# Patient Record
Sex: Male | Born: 1995 | Race: White | Hispanic: No | Marital: Single | Smoking: Current every day smoker
Health system: Southern US, Community
[De-identification: ages and names within clinical notes are randomized; demographics above are authoritative.]

## PROBLEM LIST (undated history)

## (undated) ENCOUNTER — Emergency Department (HOSPITAL_COMMUNITY): Admission: EM | Payer: Medicaid Other | Source: Home / Self Care

## (undated) DIAGNOSIS — S199XXA Unspecified injury of neck, initial encounter: Secondary | ICD-10-CM

---
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
## 2015-03-04 ENCOUNTER — Emergency Department (HOSPITAL_COMMUNITY)
Admission: EM | Admit: 2015-03-04 | Discharge: 2015-03-04 | Disposition: A | Discharge status: Home / Self Care | Payer: Medicaid Other | Attending: Emergency Medicine | Admitting: Emergency Medicine

## 2015-03-04 ENCOUNTER — Encounter (HOSPITAL_COMMUNITY): Payer: Self-pay | Admitting: Emergency Medicine

## 2015-03-04 ENCOUNTER — Emergency Department (HOSPITAL_COMMUNITY): Payer: Medicaid Other

## 2015-03-04 DIAGNOSIS — S52122A Displaced fracture of head of left radius, initial encounter for closed fracture: Secondary | ICD-10-CM

## 2015-03-04 DIAGNOSIS — S60511A Abrasion of right hand, initial encounter: Secondary | ICD-10-CM | POA: Diagnosis not present

## 2015-03-04 DIAGNOSIS — S0990XA Unspecified injury of head, initial encounter: Secondary | ICD-10-CM | POA: Diagnosis not present

## 2015-03-04 DIAGNOSIS — Y9351 Activity, roller skating (inline) and skateboarding: Secondary | ICD-10-CM | POA: Insufficient documentation

## 2015-03-04 DIAGNOSIS — S59902A Unspecified injury of left elbow, initial encounter: Secondary | ICD-10-CM | POA: Diagnosis present

## 2015-03-04 DIAGNOSIS — Y929 Unspecified place or not applicable: Secondary | ICD-10-CM | POA: Diagnosis not present

## 2015-03-04 DIAGNOSIS — S40212A Abrasion of left shoulder, initial encounter: Secondary | ICD-10-CM | POA: Insufficient documentation

## 2015-03-04 DIAGNOSIS — Y998 Other external cause status: Secondary | ICD-10-CM | POA: Insufficient documentation

## 2015-03-04 NOTE — Discharge Instructions (Signed)
Cast or Splint Care Casts and splints support injured limbs and keep bones from moving while they heal.  HOME CARE  Keep the cast or splint uncovered during the drying period.  A plaster cast can take 24 to 48 hours to dry.  A fiberglass cast will dry in less than 1 hour.  Do not rest the cast on anything harder than a pillow for 24 hours.  Do not put weight on your injured limb. Do not put pressure on the cast. Wait for your doctor's approval.  Keep the cast or splint dry.  Cover the cast or splint with a plastic bag during baths or wet weather.  If you have a cast over your chest and belly (trunk), take sponge baths until the cast is taken off.  If your cast gets wet, dry it with a towel or blow dryer. Use the cool setting on the blow dryer.  Keep your cast or splint clean. Wash a dirty cast with a damp cloth.  Do not put any objects under your cast or splint.  Do not scratch the skin under the cast with an object. If itching is a problem, use a blow dryer on a cool setting over the itchy area.  Do not trim or cut your cast.  Do not take out the padding from inside your cast.  Exercise your joints near the cast as told by your doctor.  Raise (elevate) your injured limb on 1 or 2 pillows for the first 1 to 3 days. GET HELP IF:  Your cast or splint cracks.  Your cast or splint is too tight or too loose.  You itch badly under the cast.  Your cast gets wet or has a soft spot.  You have a bad smell coming from the cast.  You get an object stuck under the cast.  Your skin around the cast becomes red or sore.  You have new or more pain after the cast is put on. GET HELP RIGHT AWAY IF:  You have fluid leaking through the cast.  You cannot move your fingers or toes.  Your fingers or toes turn blue or white or are cool, painful, or puffy (swollen).  You have tingling or lose feeling (numbness) around the injured area.  You have bad pain or pressure under the  cast.  You have trouble breathing or have shortness of breath.  You have chest pain. Document Released: 01/23/2011 Document Revised: 05/26/2013 Document Reviewed: 04/01/2013 [facility] Patient Information 2015 [employer] [place]. This information is not intended to replace advice given to you by your health care provider. Make sure you discuss any questions you have with your health care provider. You have a radial head fracture.  You've been placed in a splint to help with immobilization.  You've also been given a referral to orthopedic surgery to monitor the healing, fortunately is not displaced but you do have a small amount of fluid in the joint.  Please try to keep your arm up as much as possible, place an ice bag over the splint for 20 minutes at a time for the next 24-48 hours
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
## 2015-03-04 NOTE — ED Notes (Signed)
Ortho tech at bedside 

## 2015-03-04 NOTE — ED Provider Notes (Signed)
CSN: [id number]     Arrival date & time 03/04/15  1925 History  This chart was scribed for Earley FavorGail Lamisha Roussell, NP, Linwood DibblesJon Knapp, MD by Leona CarryG. Clay Sherrill, ED Scribe. The patient was seen in [facility]. The patient's care was started at 8:08 PM.     Chief Complaint  Patient presents with  . Elbow Injury   The history is provided by the patient. No language interpreter was used.   HPI Comments: Johnathan Flores is a 19 y.o. male who presents to the Emergency Department complaining of a left elbow injury that occurred approximately 5 hours ago while skateboarding. Patient reports that he fell twice within 10 minutes and landed on his elbow each time to brace his fall. He reports that he hit his head on a telephone pole the second time he fell. He denies LOC, dizziness, blurry vision, or neck pain. Patient reports that his last tetanus shot was approximately 8 years ago. Also has abrasions to R palm and L shoulder    History reviewed. No pertinent past medical history. History reviewed. No pertinent past surgical history. No family history on file. History  Substance Use Topics  . Smoking status: Not on file  . Smokeless tobacco: Not on file  . Alcohol Use: Not on file    Review of Systems  HENT: Negative for ear discharge.   Eyes: Negative for visual disturbance.  Musculoskeletal: Positive for joint swelling.  Skin: Positive for wound.  Neurological: Positive for headaches. Negative for dizziness.  All other systems reviewed and are negative.     Allergies  Review of patient's allergies indicates no known allergies.  Home Medications   Prior to Admission medications   Not on File   Triage Vitals: BP 114/68 mmHg  Temp(Src) 98.5 F (36.9 C) (Oral)  Resp 20  SpO2 99% Physical Exam  Constitutional: He is oriented to person, place, and time. He appears well-developed and well-nourished.  HENT:  Head: Normocephalic.  Right Ear: No swelling.  Left Ear: No swelling.  Eyes: Pupils are  equal, round, and reactive to light.  Neck: Normal range of motion. No spinous process tenderness and no muscular tenderness present. Normal range of motion present.  Cardiovascular: Normal rate and regular rhythm.   Pulmonary/Chest: Effort normal and breath sounds normal.  Musculoskeletal: He exhibits tenderness. He exhibits no edema.  Neurological: He is alert and oriented to person, place, and time.  Skin: There is erythema.  Facial abrasions to the anterior portion of his left shoulder slightly deeper abrasions to the palm of the right hand  Nursing note and vitals reviewed.   ED Course  Procedures (including critical care time) DIAGNOSTIC STUDIES: Oxygen Saturation is 99% on room air, normal by my interpretation.    COORDINATION OF CARE:    Labs Review Labs Reviewed - No data to display  Imaging Review Dg Elbow Complete Left  03/04/2015   CLINICAL DATA:  Skateboard injury with elbow pain, initial encounter  EXAM: LEFT ELBOW - COMPLETE 3+ VIEW  COMPARISON:  None.  FINDINGS: There is a minimally displaced fracture at the junction of the head and neck of the radius. Joint effusion is noted. No other fracture is seen.  IMPRESSION: Proximal radial fracture with joint effusion.   Electronically Signed   By: Alcide CleverMark  Lukens M.D.   On: 03/04/2015 20:18     EKG Interpretation None      MDM   Final diagnoses:  Radial head fracture, closed, left, initial encounter    .I  personally performed the services described in this documentation, which was scribed in my presence. The recorded information has been reviewed and is accurate.   Earley Favor, NP 03/04/15 7425  Linwood Dibbles, MD 03/08/15 [phone]

## 2015-12-30 ENCOUNTER — Emergency Department (HOSPITAL_COMMUNITY)
Admission: EM | Admit: 2015-12-30 | Discharge: 2015-12-30 | Disposition: A | Discharge status: Home / Self Care | Payer: Medicaid Other | Attending: Emergency Medicine | Admitting: Emergency Medicine

## 2015-12-30 ENCOUNTER — Encounter (HOSPITAL_COMMUNITY): Payer: Self-pay | Admitting: Emergency Medicine

## 2015-12-30 DIAGNOSIS — F17200 Nicotine dependence, unspecified, uncomplicated: Secondary | ICD-10-CM | POA: Insufficient documentation

## 2015-12-30 DIAGNOSIS — R109 Unspecified abdominal pain: Secondary | ICD-10-CM | POA: Insufficient documentation

## 2015-12-30 LAB — URINALYSIS, ROUTINE W REFLEX MICROSCOPIC
BILIRUBIN URINE: NEGATIVE
Glucose, UA: NEGATIVE mg/dL
Hgb urine dipstick: NEGATIVE
KETONES UR: NEGATIVE mg/dL
Leukocytes, UA: NEGATIVE
NITRITE: NEGATIVE
Protein, ur: NEGATIVE mg/dL
Specific Gravity, Urine: 1.004 — ABNORMAL LOW (ref 1.005–1.030)
pH: 6.5 (ref 5.0–8.0)
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
## 2015-12-30 NOTE — Discharge Instructions (Signed)
1. Medications: usual home medications 2. Treatment: rest, drink plenty of fluids 3. Follow Up: please followup with your primary doctor for discussion of your diagnoses and further evaluation after today's visit; if you do not have a primary care doctor use the phone number listed in your discharge paperwork to find one; please return to the ER for increased pain, new or worsening symptoms   Flank Pain Flank pain is pain in your side. The flank is the area of your side between your upper belly (abdomen) and your back. Pain in this area can be caused by many different things. HOME CARE Home care and treatment will depend on the cause of your pain.  Rest as told by your doctor.  Drink enough fluids to keep your pee (urine) clear or pale yellow.  Only take medicine as told by your doctor.  Tell your doctor about any changes in your pain.  Follow up with your doctor. GET HELP RIGHT AWAY IF:   Your pain does not get better with medicine.   You have new symptoms or your symptoms get worse.  Your pain gets worse.   You have belly (abdominal) pain.   You are short of breath.   You always feel sick to your stomach (nauseous).   You keep throwing up (vomiting).   You have puffiness (swelling) in your belly.   You feel light-headed or you pass out (faint).   You have blood in your pee.  You have a fever or lasting symptoms for more than 2-3 days.  You have a fever and your symptoms suddenly get worse. MAKE SURE YOU:   Understand these instructions.  Will watch your condition.  Will get help right away if you are not doing well or get worse.   This information is not intended to replace advice given to you by your health care provider. Make sure you discuss any questions you have with your health care provider.   Document Released: 07/02/2008 Document Revised: 10/14/2014 Document Reviewed: 05/07/2012 Elsevier Interactive Patient Education [employer]2016 Elsevier  Inc.

## 2015-12-30 NOTE — ED Provider Notes (Signed)
CSN: [id number]     Arrival date & time 12/30/15  0207 History   First MD Initiated Contact with Patient 12/30/15 [phone]     Chief Complaint  Patient presents with  . Flank Pain    left side    HPI   Johnathan Flores is a 20 y.o. male with no pertinent PMH who presents to the ED with left flank pain, which he states started around 2-3 AM and is now resolved. He states his pain was constant. He denies precipitating or alleviating factors. He reports increased volume of urine, though denies frequency, urgency, dysuria, hematuria. He denies fever, chills, abdominal pan, N/V/D/C. Currently, he denies complaints. He states he had one beer prior to arrival.    History reviewed. No pertinent past medical history. History reviewed. No pertinent past surgical history. No family history on file. Social History  Substance Use Topics  . Smoking status: Current Every Day Smoker -- 1.00 packs/day  . Smokeless tobacco: None  . Alcohol Use: Yes     Comment: rarely     Review of Systems  Constitutional: Negative for fever and chills.  Gastrointestinal: Negative for nausea, vomiting, abdominal pain, diarrhea and constipation.  Genitourinary: Positive for flank pain. Negative for dysuria, urgency, frequency and hematuria.      Allergies  Review of patient's allergies indicates no known allergies.  Home Medications   Prior to Admission medications   Not on File    BP 110/63 mmHg  Pulse 73  Temp(Src) 98.6 F (37 C) (Oral)  Resp 14  Ht  (1.803 m)  Wt 58.06 kg  BMI 17.86 kg/m2  SpO2 99% Physical Exam  Constitutional: He is oriented to person, place, and time. He appears well-developed and well-nourished. No distress.  HENT:  Head: Normocephalic and atraumatic.  Right Ear: External ear normal.  Left Ear: External ear normal.  Nose: Nose normal.  Mouth/Throat: Uvula is midline, oropharynx is clear and moist and mucous membranes are normal.  Eyes: Conjunctivae, EOM and lids are  normal. Pupils are equal, round, and reactive to light. Right eye exhibits no discharge. Left eye exhibits no discharge. No scleral icterus.  Neck: Normal range of motion. Neck supple.  Cardiovascular: Normal rate, regular rhythm, normal heart sounds, intact distal pulses and normal pulses.   Pulmonary/Chest: Effort normal and breath sounds normal. No respiratory distress. He has no wheezes. He has no rales.  Abdominal: Soft. Normal appearance and bowel sounds are normal. He exhibits no distension and no mass. There is no tenderness. There is no rigidity, no rebound, no guarding and no CVA tenderness.  Musculoskeletal: Normal range of motion. He exhibits no edema or tenderness.  Neurological: He is alert and oriented to person, place, and time.  Skin: Skin is warm, dry and intact. No rash noted. He is not diaphoretic. No erythema. No pallor.  Psychiatric: He has a normal mood and affect. His speech is normal and behavior is normal.  Nursing note and vitals reviewed.   ED Course  Procedures (including critical care time)  Labs Review Labs Reviewed  URINALYSIS, ROUTINE W REFLEX MICROSCOPIC (NOT AT [facility]) - Abnormal; Notable for the following:    Specific Gravity, Urine 1.004 (*)    All other components within normal limits    Imaging Review No results found.   I have personally reviewed and evaluated these lab results as part of my medical decision-making.   EKG Interpretation None      MDM   Final diagnoses:  Left  flank pain    20 year old male presents with left sided flank pain, which he reports started overnight prior to arrival. Denies frequency, urgency, dysuria, hematuria, fever, chills, abdominal pan, N/V/D/C. Patient is afebrile. Vital signs stable. Abdomen soft, non-tender, non-distended. No rebound, guarding, or masses. No CVA tenderness. UA negative for hematuria, doubt kidney stone. No evidence of UTI. 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 Patient is non-toxic and well-appearing and is clinically sober. Reports symptoms now resolved. Feel he is stable for discharge at this time. Patient to follow-up with PCP. Strict return precautions discussed. Patient verbalizes his understanding and is in agreement with plan.  BP 110/63 mmHg  Pulse 73  Temp(Src) 98.6 F (37 C) (Oral)  Resp 14  Ht 5\' 11"  (1.803 m)  Wt 58.06 kg  BMI 17.86 kg/m2  SpO2 99%     Mady Gemmalizabeth C Westfall, PA-C 12/30/15 [phone]  Raeford RazorStephen Kohut, MD 01/02/16 1026

## 2015-12-30 NOTE — ED Notes (Signed)
Bed: WA24 Expected date:  Expected time:  Means of arrival:  Comments: Needs cleaning  

## 2016-07-02 ENCOUNTER — Emergency Department (HOSPITAL_COMMUNITY)
Admission: EM | Admit: 2016-07-02 | Discharge: 2016-07-02 | Disposition: A | Discharge status: Home / Self Care | Payer: Medicaid Other | Attending: Emergency Medicine | Admitting: Emergency Medicine

## 2016-07-02 ENCOUNTER — Emergency Department (HOSPITAL_COMMUNITY): Payer: Medicaid Other

## 2016-07-02 ENCOUNTER — Encounter (HOSPITAL_COMMUNITY): Payer: Self-pay | Admitting: Emergency Medicine

## 2016-07-02 DIAGNOSIS — S62309A Unspecified fracture of unspecified metacarpal bone, initial encounter for closed fracture: Secondary | ICD-10-CM

## 2016-07-02 DIAGNOSIS — S62314A Displaced fracture of base of fourth metacarpal bone, right hand, initial encounter for closed fracture: Secondary | ICD-10-CM | POA: Insufficient documentation

## 2016-07-02 DIAGNOSIS — Y929 Unspecified place or not applicable: Secondary | ICD-10-CM | POA: Insufficient documentation

## 2016-07-02 DIAGNOSIS — F17200 Nicotine dependence, unspecified, uncomplicated: Secondary | ICD-10-CM | POA: Insufficient documentation

## 2016-07-02 DIAGNOSIS — Y999 Unspecified external cause status: Secondary | ICD-10-CM | POA: Insufficient documentation

## 2016-07-02 DIAGNOSIS — S62316A Displaced fracture of base of fifth metacarpal bone, right hand, initial encounter for closed fracture: Secondary | ICD-10-CM | POA: Insufficient documentation

## 2016-07-02 DIAGNOSIS — Y939 Activity, unspecified: Secondary | ICD-10-CM | POA: Insufficient documentation

## 2016-07-02 MED ORDER — HYDROCODONE-ACETAMINOPHEN 5-325 MG PO TABS: 1.0000 | ORAL_TABLET | Freq: Four times a day (QID) | ORAL | 0 refills | Status: DC | PRN
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
## 2016-07-02 NOTE — Discharge Instructions (Signed)
Return here as needed. Follow up with the hand surgeon provided. Ice and elevate the hand
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
## 2017-12-07 ENCOUNTER — Emergency Department (HOSPITAL_COMMUNITY)
Admission: EM | Admit: 2017-12-07 | Discharge: 2017-12-07 | Disposition: A | Discharge status: Home / Self Care | Payer: Self-pay | Attending: Emergency Medicine | Admitting: Emergency Medicine

## 2017-12-07 ENCOUNTER — Emergency Department (HOSPITAL_COMMUNITY): Payer: Self-pay

## 2017-12-07 ENCOUNTER — Encounter (HOSPITAL_COMMUNITY): Payer: Self-pay | Admitting: Emergency Medicine

## 2017-12-07 ENCOUNTER — Other Ambulatory Visit: Payer: Self-pay

## 2017-12-07 DIAGNOSIS — S161XXA Strain of muscle, fascia and tendon at neck level, initial encounter: Secondary | ICD-10-CM | POA: Insufficient documentation

## 2017-12-07 DIAGNOSIS — Y9301 Activity, walking, marching and hiking: Secondary | ICD-10-CM | POA: Insufficient documentation

## 2017-12-07 DIAGNOSIS — F17200 Nicotine dependence, unspecified, uncomplicated: Secondary | ICD-10-CM | POA: Insufficient documentation

## 2017-12-07 DIAGNOSIS — M25552 Pain in left hip: Secondary | ICD-10-CM | POA: Insufficient documentation

## 2017-12-07 DIAGNOSIS — W19XXXA Unspecified fall, initial encounter: Secondary | ICD-10-CM

## 2017-12-07 DIAGNOSIS — Y999 Unspecified external cause status: Secondary | ICD-10-CM | POA: Insufficient documentation

## 2017-12-07 DIAGNOSIS — W010XXA Fall on same level from slipping, tripping and stumbling without subsequent striking against object, initial encounter: Secondary | ICD-10-CM | POA: Insufficient documentation

## 2017-12-07 DIAGNOSIS — Y92512 Supermarket, store or market as the place of occurrence of the external cause: Secondary | ICD-10-CM | POA: Insufficient documentation

## 2017-12-07 DIAGNOSIS — M25522 Pain in left elbow: Secondary | ICD-10-CM | POA: Insufficient documentation

## 2017-12-07 DIAGNOSIS — S39012A Strain of muscle, fascia and tendon of lower back, initial encounter: Secondary | ICD-10-CM | POA: Insufficient documentation

## 2017-12-07 MED ORDER — OXYCODONE-ACETAMINOPHEN 5-325 MG PO TABS
1.0000 | ORAL_TABLET | Freq: Once | ORAL | Status: AC
  Administered 2017-12-07: 1 via ORAL
  Filled 2017-12-07: qty 1

## 2017-12-07 MED ORDER — CYCLOBENZAPRINE HCL 10 MG PO TABS: 10.0000 mg | ORAL_TABLET | Freq: Two times a day (BID) | ORAL | 0 refills | Status: DC | PRN

## 2017-12-07 MED ORDER — NAPROXEN 500 MG PO TABS: 500.0000 mg | ORAL_TABLET | Freq: Two times a day (BID) | ORAL | 0 refills | Status: AC

## 2017-12-07 NOTE — ED Provider Notes (Signed)
[facility]-EMERGENCY DEPT Provider Note   CSN: [id number] Arrival date & time: 12/07/17  1923     History   Chief Complaint Chief Complaint  Patient presents with  . Fall    HPI Johnathan Flores is a 22 y.o. male with no significant past medical history, who presents to ED for injuries that occurred after fall prior to arrival.  [place] [employer] when he slipped on a puddle of water.  He states that he fell backwards and braced himself with his left elbow.  He currently complains of left elbow pain, left hip pain, neck and lower back pain.  He denies any head injury or loss of consciousness.  He was able to stand up after the fall with assistance.  Denies any vision changes, numbness in arms or legs, loss of bowel or bladder function, prior back or neck surgeries, amnesia, blood thinner use.  HPI  History reviewed. No pertinent past medical history.  There are no active problems to display for this patient.   History reviewed. No pertinent surgical history.     Home Medications    Prior to Admission medications   Medication Sig Start Date End Date Taking? Authorizing Provider  cyclobenzaprine (FLEXERIL) 10 MG tablet Take 1 tablet (10 mg total) by mouth 2 (two) times daily as needed for muscle spasms. 12/07/17   Stanlee Roehrig, PA-C  naproxen (NAPROSYN) 500 MG tablet Take 1 tablet (500 mg total) by mouth 2 (two) times daily. 12/07/17   Dietrich Pates, PA-C    Family History History reviewed. No pertinent family history.  Social History Social History   Tobacco Use  . Smoking status: Current Every Day Smoker    Packs/day: 1.00  . Smokeless tobacco: Never Used  Substance Use Topics  . Alcohol use: Yes    Comment: rarely  . Drug use: Yes    Types: Marijuana     Allergies   Patient has no known allergies.   Review of Systems Review of Systems  Constitutional: Negative for appetite change, chills and fever.  HENT:  Negative for ear pain, rhinorrhea, sneezing and sore throat.   Eyes: Negative for photophobia and visual disturbance.  Respiratory: Negative for cough, chest tightness, shortness of breath and wheezing.   Cardiovascular: Negative for chest pain and palpitations.  Gastrointestinal: Negative for abdominal pain, blood in stool, constipation, diarrhea, nausea and vomiting.  Genitourinary: Negative for dysuria, hematuria and urgency.  Musculoskeletal: Positive for arthralgias, back pain, myalgias and neck pain.  Skin: Negative for rash.  Neurological: Negative for dizziness, weakness and light-headedness.     Physical Exam Updated Vital Signs BP (!) 110/48 (BP Location: Right Arm)   Pulse 77   Temp 98.4 F (36.9 C)   Resp (!) 7   Ht 5\' 11"  (1.803 m)   Wt 58.1 kg (128 lb)   SpO2 97%   BMI 17.85 kg/m   Physical Exam  Constitutional: He is oriented to person, place, and time. He appears well-developed and well-nourished. No distress.  HENT:  Head: Normocephalic and atraumatic.  Nose: Nose normal.  Eyes: Conjunctivae and EOM are normal. Pupils are equal, round, and reactive to light. Right eye exhibits no discharge. Left eye exhibits no discharge. No scleral icterus.  Neck: Normal range of motion. Neck supple.    Cardiovascular: Normal rate, regular rhythm, normal heart sounds and intact distal pulses. Exam reveals no gallop and no friction rub.  No murmur heard. Pulmonary/Chest: Effort normal  and breath sounds normal. No respiratory distress.  Abdominal: Soft. Bowel sounds are normal. He exhibits no distension. There is no tenderness. There is no guarding.  Musculoskeletal: Normal range of motion. He exhibits tenderness. He exhibits no edema.       Back:  Tenderness to palpation at the midline of C and L-spine as indicated in the image.  No step-off palpated. No visible bruising, edema or temperature change noted. No objective signs of numbness present. No saddle anesthesia. 2+ DP  pulses bilaterally. Sensation intact to light touch. Strength 5/5 in bilateral lower extremities. TTP of L hip and L elbow.  No visible deformities or changes to range of motion noted.  Neurological: He is alert and oriented to person, place, and time. No cranial nerve deficit or sensory deficit. He exhibits normal muscle tone. Coordination normal.  Pupils reactive. No facial asymmetry noted. Cranial nerves appear grossly intact. Sensation intact to light touch on face, BUE and BLE. Strength 5/5 in BUE and BLE.  Skin: Skin is warm and dry. No rash noted.  Psychiatric: He has a normal mood and affect.  Nursing note and vitals reviewed.    ED Treatments / Results  Labs (all labs ordered are listed, but only abnormal results are displayed) Labs Reviewed - No data to display  EKG  EKG Interpretation None       Radiology Dg Lumbar Spine Complete  Result Date: 12/07/2017 CLINICAL DATA:  Low back pain after fall today. EXAM: LUMBAR SPINE - COMPLETE 4+ VIEW COMPARISON:  None. FINDINGS: There is no evidence of lumbar spine fracture. Alignment is normal. Intervertebral disc spaces are maintained. IMPRESSION: Normal lumbar spine. Electronically Signed   By: Lupita Raider, M.D.   On: 12/07/2017 21:52   Dg Elbow Complete Left  Result Date: 12/07/2017 CLINICAL DATA:  Left elbow pain after fall today. EXAM: LEFT ELBOW - COMPLETE 3+ VIEW COMPARISON:  Radiographs of Mar 04, 2015. FINDINGS: There is no evidence of fracture, dislocation, or joint effusion. There is no evidence of arthropathy or other focal bone abnormality. Soft tissues are unremarkable. IMPRESSION: Normal left elbow. Electronically Signed   By: Lupita Raider, M.D.   On: 12/07/2017 21:50   Ct Cervical Spine Wo Contrast  Result Date: 12/07/2017 CLINICAL DATA:  Slipped on water at [employer].  Cervical neck pain. EXAM: CT CERVICAL SPINE WITHOUT CONTRAST TECHNIQUE: Multidetector CT imaging of the cervical spine was performed without  intravenous contrast. Multiplanar CT image reconstructions were also generated. COMPARISON:  None. FINDINGS: Alignment: Mild reversal of normal lordosis. No traumatic subluxation. Skull base and vertebrae: No acute fracture. Vertebral body heights are maintained. The dens and skull base are intact. Soft tissues and spinal canal: No prevertebral fluid or swelling. No visible canal hematoma. Disc levels:  Disc spaces are preserved. Upper chest: Negative. Other: None. IMPRESSION: 1. No cervical spine fracture. 2. Mild reversal of cervical lordosis typically seen with positioning or muscle spasm. Electronically Signed   By: Rubye Oaks M.D.   On: 12/07/2017 21:32   Dg Hip Unilat W Or Wo Pelvis 2-3 Views Left  Result Date: 12/07/2017 CLINICAL DATA:  Left hip pain after fall today. EXAM: DG HIP (WITH OR WITHOUT PELVIS) 2-3V LEFT COMPARISON:  None. FINDINGS: There is no evidence of hip fracture or dislocation. There is no evidence of arthropathy or other focal bone abnormality. IMPRESSION: Normal left hip. Electronically Signed   By: Lupita Raider, M.D.   On: 12/07/2017 21:48    Procedures Procedures (  including critical care time)  Medications Ordered in ED Medications  oxyCODONE-acetaminophen (PERCOCET/ROXICET) 5-325 MG per tablet 1 tablet (not administered)     Initial Impression / Assessment and Plan / ED Course  I have reviewed the triage vital signs and the nursing notes.  Pertinent labs & imaging results that were available during my care of the patient were reviewed by me and considered in my medical decision making (see chart for details).     Patient presents to ED for evaluation of fall at the grocery store that occurred just prior to arrival.  He slipped on some water on the floor and fell backwards.  He braced himself with his left elbow.  He currently complains of neck pain, lower back pain, left hip pain and left elbow pain.  On physical exam he is overall well-appearing.  He does  have some midline tenderness to palpation of the C and L-spine but no deficits on neurological examination.  He denies any head injury or loss of consciousness.  No signs of head injury on my examination.  X-rays of elbow, left hip and lumbar spine were negative.  CT of cervical spine was negative as well.  I suspect that his symptoms are due to muscle strain.  Will give anti-inflammatories, muscle relaxer to help with symptoms.  Patient would like a neck brace for comfort while he sleeps.  Low suspicion for intracranial abnormality based on the fact that he has had no deficits on his neurological examination and denies hitting his head.  Patient appears stable for discharge at this time.  Strict return precautions given.  Portions of this note were generated with Scientist, clinical (histocompatibility and immunogenetics)Dragon dictation software. Dictation errors may occur despite best attempts at proofreading.   Final Clinical Impressions(s) / ED Diagnoses   Final diagnoses:  Fall, initial encounter  Strain of neck muscle, initial encounter  Strain of lumbar region, initial encounter    ED Discharge Orders        Ordered    naproxen (NAPROSYN) 500 MG tablet  2 times daily     12/07/17 2203    cyclobenzaprine (FLEXERIL) 10 MG tablet  2 times daily PRN     12/07/17 2203       Dietrich PatesKhatri, Joran Kallal, PA-C 12/07/17 2206    Shaune PollackIsaacs, Cameron, MD 12/08/17 [phone]

## 2017-12-07 NOTE — ED Triage Notes (Signed)
Patient states he was at the [employer] and slipped on some water. Patient is complaining of left elbow, neck , and left hip. Patient is not complaining of anything else.

## 2017-12-07 NOTE — Discharge Instructions (Signed)
Please read attached information regarding your condition. Take Flexeril and naproxen scheduled for the next 2-3 days to help with your pain. Apply heating pad and stretch areas as tolerated.  Massaging the area may also help. Follow-up with the wellness center listed below for further evaluation. Wear the neck brace as needed for comfort. Return to ED for worsening symptoms, additional injuries or falls, numbness in legs, vision changes.

## 2018-07-22 ENCOUNTER — Other Ambulatory Visit: Payer: Self-pay

## 2018-07-22 ENCOUNTER — Encounter (HOSPITAL_COMMUNITY): Payer: Self-pay

## 2018-07-22 ENCOUNTER — Emergency Department (HOSPITAL_COMMUNITY)
Admission: EM | Admit: 2018-07-22 | Discharge: 2018-07-23 | Disposition: A | Discharge status: Home / Self Care | Payer: Medicaid Other | Attending: Emergency Medicine | Admitting: Emergency Medicine

## 2018-07-22 DIAGNOSIS — R05 Cough: Secondary | ICD-10-CM | POA: Insufficient documentation

## 2018-07-22 DIAGNOSIS — Z79899 Other long term (current) drug therapy: Secondary | ICD-10-CM | POA: Insufficient documentation

## 2018-07-22 DIAGNOSIS — J029 Acute pharyngitis, unspecified: Secondary | ICD-10-CM | POA: Insufficient documentation

## 2018-07-22 DIAGNOSIS — F17200 Nicotine dependence, unspecified, uncomplicated: Secondary | ICD-10-CM | POA: Insufficient documentation

## 2018-07-22 LAB — GROUP A STREP BY PCR: Group A Strep by PCR: NOT DETECTED

## 2018-07-22 MED ORDER — KETOROLAC TROMETHAMINE 60 MG/2ML IM SOLN
60.0000 mg | Freq: Once | INTRAMUSCULAR | Status: AC
  Administered 2018-07-22: 60 mg via INTRAMUSCULAR
  Filled 2018-07-22: qty 2

## 2018-07-22 MED ORDER — GI COCKTAIL ~~LOC~~
30.0000 mL | Freq: Once | ORAL | Status: AC
  Administered 2018-07-22: 30 mL via ORAL
  Filled 2018-07-22: qty 30

## 2018-07-22 NOTE — ED Triage Notes (Signed)
Pt reports sore throat x 5 days. He reports that it is worse on the L. He also endorses chills, body aches, and fatigue. Denies N/V/D.

## 2018-07-22 NOTE — ED Provider Notes (Signed)
[facility]-EMERGENCY DEPT Provider Note   CSN: [id number] Arrival date & time: 07/22/18  2103    History   Chief Complaint Chief Complaint  Patient presents with  . Sore Throat    HPI Johnathan Flores is a 22 y.o. male.   22 year old male presents to the emergency department for evaluation of sore throat.  States that symptoms began 1.5 weeks ago.  He states that symptoms were gradually improving, but then worsened after forceful coughing a few days ago.  States that he has had a sharp pain to the left side of his throat and neck which is aggravated with swallowing.  Has been taking some over-the-counter off brand cold medicine without relief.  Reports chills, body aches, profound fatigue.  No nausea, vomiting, diarrhea.  Is able to tolerate secretions, but has been spitting them in a bag due to discomfort.     History reviewed. No pertinent past medical history.  There are no active problems to display for this patient.   History reviewed. No pertinent surgical history.      Home Medications    Prior to Admission medications   Medication Sig Start Date End Date Taking? Authorizing Provider  cyclobenzaprine (FLEXERIL) 10 MG tablet Take 1 tablet (10 mg total) by mouth 2 (two) times daily as needed for muscle spasms. 12/07/17   Khatri, Hina, PA-C  magic mouthwash w/lidocaine SOLN Take 10 mLs by mouth 3 (three) times daily as needed for mouth pain. 07/23/18   Antony Madura, PA-C  naproxen (NAPROSYN) 500 MG tablet Take 1 tablet (500 mg total) by mouth 2 (two) times daily. 12/07/17   Dietrich Pates, PA-C    Family History History reviewed. No pertinent family history.  Social History Social History   Tobacco Use  . Smoking status: Current Every Day Smoker    Packs/day: 1.00  . Smokeless tobacco: Never Used  Substance Use Topics  . Alcohol use: Yes    Comment: rarely  . Drug use: Yes    Types: Marijuana     Allergies   Patient has no known  allergies.   Review of Systems Review of Systems Ten systems reviewed and are negative for acute change, except as noted in the HPI.    Physical Exam Updated Vital Signs BP 114/62 (BP Location: Right Arm)   Pulse 74   Temp 98 F (36.7 C) (Oral)   Resp 15   SpO2 98%   Physical Exam  Constitutional: He is oriented to person, place, and time. He appears well-developed and well-nourished. No distress.  Nontoxic appearing and in no distress  HENT:  Head: Normocephalic and atraumatic.  Mild posterior oropharyngeal erythema without edema.  Uvula midline.  Patient tolerating secretions without difficulty.  No tripoding or stridor.  Eyes: Conjunctivae and EOM are normal. No scleral icterus.  Neck: Normal range of motion.  Tender left lymphadenopathy.  No meningismus.  Pulmonary/Chest: Effort normal. No respiratory distress.  Respirations even and unlabored  Musculoskeletal: Normal range of motion.  Neurological: He is alert and oriented to person, place, and time. He exhibits normal muscle tone. Coordination normal.  Skin: Skin is warm and dry. No rash noted. He is not diaphoretic. No erythema. No pallor.  Psychiatric: He has a normal mood and affect. His behavior is normal.  Nursing note and vitals reviewed.    ED Treatments / Results  Labs (all labs ordered are listed, but only abnormal results are displayed) Labs Reviewed  GROUP A STREP BY PCR  MONONUCLEOSIS  SCREEN  GC/CHLAMYDIA PROBE AMP ([place]) NOT AT [facility]    EKG None  Radiology No results found.  Procedures Procedures (including critical care time)  Medications Ordered in ED Medications  ketorolac (TORADOL) injection 60 mg (60 mg Intramuscular Given 07/22/18 2358)  gi cocktail (Maalox,Lidocaine,Donnatal) (30 mLs Oral Given 07/22/18 2357)    12:54 AM Symptoms improved following GI cocktail and Toradol.  Is tolerating secretions without difficulty at this time.   Initial Impression / Assessment and  Plan / ED Course  I have reviewed the triage vital signs and the nursing notes.  Pertinent labs & imaging results that were available during my care of the patient were reviewed by me and considered in my medical decision making (see chart for details).     Patient afebrile without tonsillar exudate, negative strep. Presents with mild cervical lymphadenopathy and dysphagia; diagnosis of viral pharyngitis. No abx indicated. Will discharge with symptomatic tx for pain. Presentation not concerning for PTA or infxn spread to soft tissue. No trismus or uvula deviation. Normal phonation. Tolerating secretions. Encouraged PCP follow up. Return precautions discussed and provided. Patient discharged in stable condition with no unaddressed concerns.   Final Clinical Impressions(s) / ED Diagnoses   Final diagnoses:  Sore throat    ED Discharge Orders         Ordered    magic mouthwash w/lidocaine SOLN  3 times daily PRN    Note to Pharmacy:  Doree Barthel, Maalox, Lidocaine at 1:1:1   07/23/18 0053           Antony Madura, PA-C 07/23/18 0056    Molpus, Jonny Ruiz, MD 07/23/18 1610

## 2018-07-23 LAB — MONONUCLEOSIS SCREEN: MONO SCREEN: NEGATIVE

## 2018-07-23 LAB — GC/CHLAMYDIA PROBE AMP (~~LOC~~) NOT AT ARMC
CHLAMYDIA, DNA PROBE: NEGATIVE
NEISSERIA GONORRHEA: NEGATIVE

## 2018-07-23 MED ORDER — MAGIC MOUTHWASH W/LIDOCAINE: 10.0000 mL | Freq: Three times a day (TID) | ORAL | 1 refills | Status: AC | PRN

## 2018-07-23 NOTE — Discharge Instructions (Signed)
We recommend the use of 600 mg ibuprofen every 6 hours for pain.  You may use Magic mouthwash as prescribed for throat pain as well.  Be sure to drink plenty of clear liquids to prevent dehydration.  Follow-up on your gonorrhea and Chlamydia swab through MyChart.  You may return for new or concerning symptoms.

## 2018-07-29 ENCOUNTER — Other Ambulatory Visit: Payer: Self-pay

## 2018-07-29 ENCOUNTER — Emergency Department (HOSPITAL_COMMUNITY)
Admission: EM | Admit: 2018-07-29 | Discharge: 2018-07-30 | Disposition: A | Discharge status: Home / Self Care | Payer: Self-pay | Attending: Emergency Medicine | Admitting: Emergency Medicine

## 2018-07-29 ENCOUNTER — Encounter (HOSPITAL_COMMUNITY): Payer: Self-pay | Admitting: Emergency Medicine

## 2018-07-29 DIAGNOSIS — F17210 Nicotine dependence, cigarettes, uncomplicated: Secondary | ICD-10-CM | POA: Insufficient documentation

## 2018-07-29 DIAGNOSIS — Z79899 Other long term (current) drug therapy: Secondary | ICD-10-CM | POA: Insufficient documentation

## 2018-07-29 DIAGNOSIS — N200 Calculus of kidney: Secondary | ICD-10-CM | POA: Insufficient documentation

## 2018-07-29 NOTE — ED Triage Notes (Signed)
Per EMS, patient from home, left flank pain radiating to abdomen x1 hour. Reports N/V. Denies urinary sx.

## 2018-07-30 ENCOUNTER — Emergency Department (HOSPITAL_COMMUNITY): Payer: Self-pay

## 2018-07-30 LAB — URINALYSIS, ROUTINE W REFLEX MICROSCOPIC
BILIRUBIN URINE: NEGATIVE
Glucose, UA: NEGATIVE mg/dL
KETONES UR: 5 mg/dL — AB
Leukocytes, UA: NEGATIVE
Nitrite: NEGATIVE
Protein, ur: NEGATIVE mg/dL
SPECIFIC GRAVITY, URINE: 1.005 (ref 1.005–1.030)
pH: 7 (ref 5.0–8.0)

## 2018-07-30 MED ORDER — ONDANSETRON 4 MG PO TBDP: 4.0000 mg | ORAL_TABLET | Freq: Three times a day (TID) | ORAL | 0 refills | Status: AC | PRN

## 2018-07-30 MED ORDER — IBUPROFEN 600 MG PO TABS: 600.0000 mg | ORAL_TABLET | Freq: Four times a day (QID) | ORAL | 0 refills | Status: DC | PRN

## 2018-07-30 MED ORDER — LORAZEPAM 1 MG PO TABS
1.0000 mg | ORAL_TABLET | Freq: Once | ORAL | Status: AC
  Administered 2018-07-30: 1 mg via ORAL
  Filled 2018-07-30: qty 1

## 2018-07-30 NOTE — ED Provider Notes (Signed)
[facility]-EMERGENCY DEPT Provider Note   CSN: [id number] Arrival date & time: 07/29/18  2142     History   Chief Complaint Chief Complaint  Patient presents with  . Flank Pain    HPI Johnathan Flores is a 22 y.o. male.  Patient presents for evaluation of pain in the left flank area that started suddenly while at home. There was nausea and vomiting when the pain was the worst. The pain eventually radiated to the LLQ abdomen. No urinary symptoms or known history of kidney stones. No fever or diarrhea. The pain has subsided and he denies any current nausea. He had similar symptoms in the past that went without evaluation.  The history is provided by the patient. No language interpreter was used.  Flank Pain  Associated symptoms include abdominal pain. Pertinent negatives include no chest pain and no shortness of breath.    History reviewed. No pertinent past medical history.  There are no active problems to display for this patient.   History reviewed. No pertinent surgical history.      Home Medications    Prior to Admission medications   Medication Sig Start Date End Date Taking? Authorizing Provider  cyclobenzaprine (FLEXERIL) 10 MG tablet Take 1 tablet (10 mg total) by mouth 2 (two) times daily as needed for muscle spasms. 12/07/17   Khatri, Hina, PA-C  magic mouthwash w/lidocaine SOLN Take 10 mLs by mouth 3 (three) times daily as needed for mouth pain. 07/23/18   Antony Madura, PA-C  naproxen (NAPROSYN) 500 MG tablet Take 1 tablet (500 mg total) by mouth 2 (two) times daily. 12/07/17   Dietrich Pates, PA-C    Family History No family history on file.  Social History Social History   Tobacco Use  . Smoking status: Current Every Day Smoker    Packs/day: 1.00  . Smokeless tobacco: Never Used  Substance Use Topics  . Alcohol use: Yes    Comment: rarely  . Drug use: Yes    Types: Marijuana     Allergies   Patient has no known  allergies.   Review of Systems Review of Systems  Constitutional: Negative for chills and fever.  Respiratory: Negative.  Negative for shortness of breath.   Cardiovascular: Negative.  Negative for chest pain.  Gastrointestinal: Positive for abdominal pain, nausea and vomiting.  Genitourinary: Positive for flank pain. Negative for decreased urine volume, dysuria, hematuria and testicular pain.  Skin: Negative.   Neurological: Negative.      Physical Exam Updated Vital Signs BP 113/73 (BP Location: Right Arm)   Pulse (!) 50   Temp 98.1 F (36.7 C) (Oral)   Resp 16   Ht 5\' 11"  (1.803 m)   Wt 58.1 kg   SpO2 99%   BMI 17.85 kg/m   Physical Exam  Constitutional: He is oriented to person, place, and time. He appears well-developed and well-nourished.  HENT:  Head: Normocephalic.  Neck: Normal range of motion. Neck supple.  Cardiovascular: Normal rate and regular rhythm.  Pulmonary/Chest: Effort normal and breath sounds normal. He has no wheezes. He has no rales.  Abdominal: Soft. Bowel sounds are normal. There is no tenderness. There is no rebound and no guarding.  Genitourinary:  Genitourinary Comments: No left CVA tenderness.   Musculoskeletal: Normal range of motion.  Neurological: He is alert and oriented to person, place, and time.  Skin: Skin is warm and dry. No rash noted.  Psychiatric: He has a normal mood and affect.  ED Treatments / Results  Labs (all labs ordered are listed, but only abnormal results are displayed) Labs Reviewed  URINALYSIS, ROUTINE W REFLEX MICROSCOPIC    EKG None  Radiology No results found.  Procedures Procedures (including critical care time)  Medications Ordered in ED Medications  LORazepam (ATIVAN) tablet 1 mg (has no administration in time range)     Initial Impression / Assessment and Plan / ED Course  I have reviewed the triage vital signs and the nursing notes.  Pertinent labs & imaging results that were  available during my care of the patient were reviewed by me and considered in my medical decision making (see chart for details).     Patient here for evaluation of left flank pain, radiating to abdomen, associated with N, V. Symptoms have resolved while in the ED without intervention. Similar symptoms in the past.   Patient is significantly anxious in the ED and reports he feels uncontrollably nervous. He has had similar symptoms before. Ativan provided with improvement.   UA shows no infection but does show small blood. CT renal ordered and shows left nephrolithiasis. No stone in ureter.   Symptoms clinically support dx of kidney stone, likely passed when pain subsided.   Final Clinical Impressions(s) / ED Diagnoses   Final diagnoses:  None   1. Ureteral colic  ED Discharge Orders    None       Elpidio Anis, PA-C 08/03/18 1610    Shon Baton, MD 08/04/18 [phone]

## 2018-07-30 NOTE — ED Notes (Signed)
Patient transported to CT 

## 2018-10-18 ENCOUNTER — Other Ambulatory Visit: Payer: Self-pay

## 2018-10-18 ENCOUNTER — Emergency Department (HOSPITAL_BASED_OUTPATIENT_CLINIC_OR_DEPARTMENT_OTHER): Payer: Self-pay

## 2018-10-18 ENCOUNTER — Emergency Department (HOSPITAL_BASED_OUTPATIENT_CLINIC_OR_DEPARTMENT_OTHER)
Admission: EM | Admit: 2018-10-18 | Discharge: 2018-10-18 | Disposition: A | Discharge status: Home / Self Care | Payer: Self-pay | Attending: Emergency Medicine | Admitting: Emergency Medicine

## 2018-10-18 ENCOUNTER — Encounter (HOSPITAL_BASED_OUTPATIENT_CLINIC_OR_DEPARTMENT_OTHER): Payer: Self-pay | Admitting: Adult Health

## 2018-10-18 DIAGNOSIS — S82892A Other fracture of left lower leg, initial encounter for closed fracture: Secondary | ICD-10-CM

## 2018-10-18 DIAGNOSIS — S8252XA Displaced fracture of medial malleolus of left tibia, initial encounter for closed fracture: Secondary | ICD-10-CM | POA: Insufficient documentation

## 2018-10-18 DIAGNOSIS — W108XXA Fall (on) (from) other stairs and steps, initial encounter: Secondary | ICD-10-CM | POA: Insufficient documentation

## 2018-10-18 DIAGNOSIS — Y929 Unspecified place or not applicable: Secondary | ICD-10-CM | POA: Insufficient documentation

## 2018-10-18 DIAGNOSIS — Y999 Unspecified external cause status: Secondary | ICD-10-CM | POA: Insufficient documentation

## 2018-10-18 DIAGNOSIS — F17200 Nicotine dependence, unspecified, uncomplicated: Secondary | ICD-10-CM | POA: Insufficient documentation

## 2018-10-18 DIAGNOSIS — Z79899 Other long term (current) drug therapy: Secondary | ICD-10-CM | POA: Insufficient documentation

## 2018-10-18 DIAGNOSIS — Y9301 Activity, walking, marching and hiking: Secondary | ICD-10-CM | POA: Insufficient documentation

## 2018-10-18 HISTORY — DX: Unspecified injury of neck, initial encounter: S19.9XXA

## 2018-10-18 MED ORDER — HYDROCODONE-ACETAMINOPHEN 5-325 MG PO TABS
1.0000 | ORAL_TABLET | Freq: Once | ORAL | Status: AC
  Administered 2018-10-18: 1 via ORAL
  Filled 2018-10-18: qty 1

## 2018-10-18 MED ORDER — METHOCARBAMOL 500 MG PO TABS
500.0000 mg | ORAL_TABLET | Freq: Once | ORAL | Status: AC
  Administered 2018-10-18: 500 mg via ORAL
  Filled 2018-10-18: qty 1

## 2018-10-18 MED ORDER — METHOCARBAMOL 500 MG PO TABS: 500.0000 mg | ORAL_TABLET | Freq: Every evening | ORAL | 0 refills | Status: DC | PRN

## 2018-10-18 MED ORDER — HYDROCODONE-ACETAMINOPHEN 5-325 MG PO TABS: 1.0000 | ORAL_TABLET | Freq: Four times a day (QID) | ORAL | 0 refills | Status: DC | PRN

## 2018-10-18 NOTE — Discharge Instructions (Addendum)
Go to Dr. Greig Right office tomorrow at 830 am for further evaluation of your ankle. Stay nonweightbearing until further instructions by orthopedics. Keep your leg elevated as much as possible. Take ibuprofen 3 times a day with meals.  Do not take other anti-inflammatories at the same time (Advil, Motrin, naproxen, Aleve). You may supplement with Tylenol if you need further pain control. Use Norco as needed for severe breakthrough pain.  Have caution while taking this medicine, as that is a narcotic pain medicine.  Do not drive while taking this. Return to the emergency room with any new, worsening, concerning symptoms.

## 2018-10-18 NOTE — ED Notes (Addendum)
Patient had bruising to medial and anterior ankle. Swelling noted to foot before splint was placed. CMS intact before and after splint. Pt complained his muscles were tense and spasms. PA notified. Otherwise patient sad ankle felt better with splint applied.

## 2018-10-18 NOTE — ED Triage Notes (Signed)
Presents with left ankle injury that occurred last night. He was taking trash outside and his foot slid off a step and hit another step below it. His left ankle is swollen, bruised. He denies much pain, only with movement.

## 2018-10-18 NOTE — ED Provider Notes (Signed)
[facility] EMERGENCY DEPARTMENT Provider Note   CSN: [id number] Arrival date & time: 10/18/18  1629     History   Chief Complaint Chief Complaint  Patient presents with  . Ankle Pain    HPI Johnathan Flores is a 23 y.o. male presenting for evaluation of left ankle pain.  Patient states he was walking downstairs yesterday when he slipped, injuring his left ankle.  He denies hitting his head or loss of consciousness.  He reports pain of his entire left ankle.  Pain worsens with movement, palpation, and weightbearing.  He denies pain in his foot or his leg.  He denies numbness or tingling.  He took ibuprofen last night without improvement of symptoms.  He has not tried anything else for pain.  No radiation of the pain.  He denies headache, neck pain, back pain, or injury elsewhere.  He has no medical problems, takes no medications daily.  He is not on blood thinners.  HPI  Past Medical History:  Diagnosis Date  . Neck injury     There are no active problems to display for this patient.   History reviewed. No pertinent surgical history.      Home Medications    Prior to Admission medications   Medication Sig Start Date End Date Taking? Authorizing Provider  cyclobenzaprine (FLEXERIL) 10 MG tablet Take 1 tablet (10 mg total) by mouth 2 (two) times daily as needed for muscle spasms. 12/07/17   Khatri, Hina, PA-C  ibuprofen (ADVIL,MOTRIN) 600 MG tablet Take 1 tablet (600 mg total) by mouth every 6 (six) hours as needed. 07/30/18   Elpidio AnisUpstill, Shari, PA-C  magic mouthwash w/lidocaine SOLN Take 10 mLs by mouth 3 (three) times daily as needed for mouth pain. 07/23/18   Antony MaduraHumes, Kelly, PA-C  naproxen (NAPROSYN) 500 MG tablet Take 1 tablet (500 mg total) by mouth 2 (two) times daily. 12/07/17   Khatri, Hina, PA-C  ondansetron (ZOFRAN ODT) 4 MG disintegrating tablet Take 1 tablet (4 mg total) by mouth every 8 (eight) hours as needed for nausea or vomiting. 07/30/18   Elpidio AnisUpstill, Shari,  PA-C    Family History History reviewed. No pertinent family history.  Social History Social History   Tobacco Use  . Smoking status: Current Every Day Smoker    Packs/day: 1.00  . Smokeless tobacco: Never Used  Substance Use Topics  . Alcohol use: Yes    Comment: rarely  . Drug use: Yes    Types: Marijuana     Allergies   Patient has no known allergies.   Review of Systems Review of Systems  Musculoskeletal: Positive for arthralgias and joint swelling.  All other systems reviewed and are negative.    Physical Exam Updated Vital Signs BP (!) 138/91 (BP Location: Right Arm)   Pulse 93   Temp 98.9 F (37.2 C) (Oral)   Resp 18   Ht 5\' 11"  (1.803 m)   Wt 57.6 kg   SpO2 100%   BMI 17.71 kg/m   Physical Exam Vitals signs and nursing note reviewed.  Constitutional:      General: He is not in acute distress.    Appearance: He is well-developed.     Comments: Appears uncomfortable due to pain, no acute distress  HENT:     Head: Normocephalic and atraumatic.  Neck:     Musculoskeletal: Normal range of motion.  Pulmonary:     Effort: Pulmonary effort is normal.  Abdominal:     General: There is no  distension.  Musculoskeletal:        General: Swelling, tenderness and signs of injury present.     Comments: Swelling of the left ankle.  Contusions noted circumferentially around the ankle.  Pedal pulses intact bilaterally.  Good cap refill of distal toes. Full ROM of the toes.  She unwilling to range ankle due to pain.  No tenderness palpation of calf or small lower leg.  Tenderness to palpation of light touch of light.  No tenderness palpation of the Achilles tendon, palpable and intact.  Skin:    General: Skin is warm.     Capillary Refill: Capillary refill takes less than 2 seconds.     Findings: No rash.  Neurological:     General: No focal deficit present.     Mental Status: He is alert and oriented to person, place, and time.      ED Treatments /  Results  Labs (all labs ordered are listed, but only abnormal results are displayed) Labs Reviewed - No data to display  EKG None  Radiology Dg Ankle Complete Left  Result Date: 10/18/2018 CLINICAL DATA:  Larey Seat on steps last night. Left ankle pain and swelling. Initial encounter. EXAM: LEFT ANKLE COMPLETE - 3+ VIEW COMPARISON:  None. FINDINGS: Mildly displaced fractures of the distal tibia are seen which involve the anterior, medial, and posterior malleoli. No distal fibular fracture identified. Ankle mortise remains intact, and there is no evidence of dislocation. IMPRESSION: Mildly displaced distal tibial fractures involving the anterior, medial, and posterior malleoli. No distal fibular fracture identified. Electronically Signed   By: Myles Rosenthal M.D.   On: 10/18/2018 17:45    Procedures .Splint Application Date/Time: 10/19/2018 1:50 AM Performed by: Steffanie Dunn, EMT Authorized by: Alveria Apley, PA-C   Consent:    Consent obtained:  Verbal   Consent given by:  Patient   Risks discussed:  Pain, swelling, numbness and discoloration Pre-procedure details:    Sensation:  Normal Procedure details:    Laterality:  Left   Location:  Ankle   Ankle:  L ankle   Strapping: yes     Cast type:  Short leg   Splint type: posterior splint with stirrup. Post-procedure details:    Pain:  Unchanged   Sensation:  Normal   Skin color:  Pink   Patient tolerance of procedure:  Tolerated well, no immediate complications   (including critical care time)  Medications Ordered in ED Medications - No data to display   Initial Impression / Assessment and Plan / ED Course  I have reviewed the triage vital signs and the nursing notes.  Pertinent labs & imaging results that were available during my care of the patient were reviewed by me and considered in my medical decision making (see chart for details).     Patient resenting for evaluation of left ankle pain after a fall yesterday.   Physical exam reassuring, he is neurovascularly intact.  However, significant swelling and contusion.  Will obtain x-rays for further evaluation.  X-rays viewed interpreted by me, shows fracture of the distal tibia at the medial, lateral, and anterior malleoli.  Mortise is intact.  Patient remains neurovascularly intact.  Will consult with orthopedics.   Discussed with Dr. Eulah Pont, who recommends posterior splint with stirrup, and follow-up in the office tomorrow at 8:30 AM. Remain nonweightbearing. Recommends elevation and pain control.  Splint applied by tech, patient with good cap refill, sensation, and movement of distal toes after splint placement.  Patient is reporting  spasming of the muscles.  As such, will give muscle relaxer and short course for home use.  At this time, patient appears safe for discharge.  Return precautions given.  Patient states he understands and agrees to plan.  Final Clinical Impressions(s) / ED Diagnoses   Final diagnoses:  Closed fracture of malleolus of left ankle, initial encounter    ED Discharge Orders    None       Alveria ApleyCaccavale, [place] Hanlon, PA-C 10/19/18 0151    Doug SouJacubowitz, Sam, MD 10/19/18 1051

## 2018-10-26 ENCOUNTER — Emergency Department (HOSPITAL_COMMUNITY): Payer: Self-pay

## 2018-10-26 ENCOUNTER — Encounter (HOSPITAL_COMMUNITY): Payer: Self-pay | Admitting: *Deleted

## 2018-10-26 ENCOUNTER — Emergency Department (HOSPITAL_COMMUNITY)
Admission: EM | Admit: 2018-10-26 | Discharge: 2018-10-26 | Disposition: A | Discharge status: Home / Self Care | Payer: Self-pay | Attending: Emergency Medicine | Admitting: Emergency Medicine

## 2018-10-26 DIAGNOSIS — Y33XXXD Other specified events, undetermined intent, subsequent encounter: Secondary | ICD-10-CM | POA: Insufficient documentation

## 2018-10-26 DIAGNOSIS — F17200 Nicotine dependence, unspecified, uncomplicated: Secondary | ICD-10-CM | POA: Insufficient documentation

## 2018-10-26 DIAGNOSIS — S82892D Other fracture of left lower leg, subsequent encounter for closed fracture with routine healing: Secondary | ICD-10-CM | POA: Insufficient documentation

## 2018-10-26 MED ORDER — OXYCODONE-ACETAMINOPHEN 5-325 MG PO TABS
1.0000 | ORAL_TABLET | Freq: Once | ORAL | Status: AC
  Administered 2018-10-26: 1 via ORAL
  Filled 2018-10-26: qty 1

## 2018-10-26 MED ORDER — CYCLOBENZAPRINE HCL 10 MG PO TABS: 10.0000 mg | ORAL_TABLET | Freq: Two times a day (BID) | ORAL | 0 refills | Status: AC | PRN

## 2018-10-26 MED ORDER — IBUPROFEN 600 MG PO TABS: 600.0000 mg | ORAL_TABLET | Freq: Four times a day (QID) | ORAL | 0 refills | Status: AC | PRN

## 2018-10-26 NOTE — ED Provider Notes (Signed)
[facility] Provider Note   CSN: [id number] Arrival date & time: 10/26/18  1853     History   Chief Complaint Chief Complaint  Patient presents with  . Ankle Injury    HPI Johnathan Flores is a 23 y.o. male.  The history is provided by the patient and medical records. No language interpreter was used.  Ankle Injury      23 year old male presenting for evaluation of left ankle injury.  Patient report 9 days ago he slipped while walking down the steps and injured his left ankle.  He was seen in the ED the next day.  An x-ray obtained demonstrate a fracture of the distal tibia at the medial, lateral, and anterior malleoli and the mortise was intact.  Patient was neurovascular intact.  Orthopedic, Dr. Eulah PontMurphy was consulted who recommend posterior splint with stirrup and to follow-up in his office the next day.  Patient to remain nonweightbearing.  Patient report he was unable to follow-up with orthopedic as recommended.  He mention he could not follow-up the next day because it was raining and he does not want the splint to get wet.  He did reach out to the orthopedic office but was told that he has to have $200 upfront to be seen.  Patient states he does not have any money to follow-up at this time.  He is here complaining of worsening pain primarily to the mid lower leg for the past 7 days.  Described pain as an achy throbbing pulsating pain, moderate to severe, worse since he ran out of his pain medication.  He does not complain of any tingling or numbness sensation to his foot but he does feel that the splint is tight.  He has been nonweightbearing.  Past Medical History:  Diagnosis Date  . Neck injury     There are no active problems to display for this patient.   History reviewed. No pertinent surgical history.      Home Medications    Prior to Admission medications   Medication Sig Start Date End Date Taking? Authorizing Provider    cyclobenzaprine (FLEXERIL) 10 MG tablet Take 1 tablet (10 mg total) by mouth 2 (two) times daily as needed for muscle spasms. 12/07/17   Khatri, Hina, PA-C  HYDROcodone-acetaminophen (NORCO/VICODIN) 5-325 MG tablet Take 1 tablet by mouth every 6 (six) hours as needed for severe pain. 10/18/18   Caccavale, Sophia, PA-C  ibuprofen (ADVIL,MOTRIN) 600 MG tablet Take 1 tablet (600 mg total) by mouth every 6 (six) hours as needed. 07/30/18   Elpidio AnisUpstill, Shari, PA-C  magic mouthwash w/lidocaine SOLN Take 10 mLs by mouth 3 (three) times daily as needed for mouth pain. 07/23/18   Antony MaduraHumes, Kelly, PA-C  methocarbamol (ROBAXIN) 500 MG tablet Take 1 tablet (500 mg total) by mouth at bedtime as needed for muscle spasms. 10/18/18   Caccavale, Sophia, PA-C  naproxen (NAPROSYN) 500 MG tablet Take 1 tablet (500 mg total) by mouth 2 (two) times daily. 12/07/17   Khatri, Hina, PA-C  ondansetron (ZOFRAN ODT) 4 MG disintegrating tablet Take 1 tablet (4 mg total) by mouth every 8 (eight) hours as needed for nausea or vomiting. 07/30/18   Elpidio AnisUpstill, Shari, PA-C    Family History History reviewed. No pertinent family history.  Social History Social History   Tobacco Use  . Smoking status: Current Every Day Smoker    Packs/day: 1.00  . Smokeless tobacco: Never Used  Substance Use Topics  . Alcohol use:  Yes    Comment: rarely  . Drug use: Yes    Types: Marijuana     Allergies   Patient has no known allergies.   Review of Systems Review of Systems  Constitutional: Negative for fever.  Musculoskeletal: Positive for arthralgias.  Neurological: Negative for numbness.     Physical Exam Updated Vital Signs BP 108/62 (BP Location: Right Arm)   Pulse 77   Temp 98.3 F (36.8 C) (Oral)   Resp 18   SpO2 100%   Physical Exam Vitals signs and nursing note reviewed.  Constitutional:      General: He is not in acute distress.    Appearance: He is well-developed.  HENT:     Head: Atraumatic.  Eyes:      Conjunctiva/sclera: Conjunctivae normal.  Neck:     Musculoskeletal: Neck supple.  Musculoskeletal:        General: Signs of injury (Left lower extremity currently wrapped in posterior splint.  Splint removed, ecchymosis noted throughout lower leg, sensation intact, pedal pulse palpable.  Leg compartment soft.) present.  Skin:    Findings: No rash.  Neurological:     Mental Status: He is alert.      ED Treatments / Results  Labs (all labs ordered are listed, but only abnormal results are displayed) Labs Reviewed - No data to display  EKG None  Radiology No results found.  Procedures Procedures (including critical care time)  Medications Ordered in ED Medications - No data to display   Initial Impression / Assessment and Plan / ED Course  I have reviewed the triage vital signs and the nursing notes.  Pertinent labs & imaging results that were available during my care of the patient were reviewed by me and considered in my medical decision making (see chart for details).     BP 108/62 (BP Location: Right Arm)   Pulse 77   Temp 98.3 F (36.8 C) (Oral)   Resp 18   SpO2 100%    Final Clinical Impressions(s) / ED Diagnoses   Final diagnoses:  Closed fracture of left ankle with routine healing, subsequent encounter    ED Discharge Orders         Ordered    ibuprofen (ADVIL,MOTRIN) 600 MG tablet  Every 6 hours PRN     10/26/18 2212    cyclobenzaprine (FLEXERIL) 10 MG tablet  2 times daily PRN     10/26/18 2212         9:43 PM Patient report worsening left lower leg pain after he broke his left ankle 9 days ago and had it splinted.  He was unable to follow-up or with orthopedic.  I did remove his splint to assess the skin, no evidence of compartment syndrome.  Sensation is intact, dorsalis pedis pulse palpable.  Will replace splint and provide additional pain medication but patient should follow-up with orthopedic for further management as an appropriate  casting.   Fayrene Helper, PA-C 10/26/18 2213    Wynetta Fines, MD 10/27/18 2214

## 2018-10-26 NOTE — Progress Notes (Signed)
Orthopedic Tech Progress Note Patient Details:  Johnathan Flores [birth year] [id number]  Ortho Devices Type of Ortho Device: Post (short leg) splint, Stirrup splint Ortho Device/Splint Location: lle Ortho Device/Splint Interventions: Ordered, Application, Adjustment   Post Interventions Patient Tolerated: Well Instructions Provided: Care of device, Adjustment of device   [name] 10/26/2018, 10:27 PM

## 2018-10-26 NOTE — ED Triage Notes (Signed)
Pt in for re-evaluation of his left ankle injury, seen at med center on the 12th and had a splint placed, was unable to follow up with ortho and is out of pain medication, here for have his splint checked and for pain control, no distress noted

## 2018-10-26 NOTE — ED Notes (Signed)
Pt discharged from ED; instructions provided and scripts given; Pt encouraged to return to ED if symptoms worsen and to f/u with PCP; Pt verbalized understanding of all instructions 

## 2018-10-26 NOTE — Discharge Instructions (Signed)
Please call and follow up closely with orthopedist specialist for further management of your ankle fracture.

## 2019-03-19 ENCOUNTER — Emergency Department (HOSPITAL_COMMUNITY)
Admission: EM | Admit: 2019-03-19 | Discharge: 2019-03-19 | Disposition: A | Discharge status: Home / Self Care | Payer: Medicaid Other | Attending: Emergency Medicine | Admitting: Emergency Medicine

## 2019-03-19 ENCOUNTER — Encounter (HOSPITAL_COMMUNITY): Payer: Self-pay

## 2019-03-19 ENCOUNTER — Other Ambulatory Visit: Payer: Self-pay

## 2019-03-19 DIAGNOSIS — Y999 Unspecified external cause status: Secondary | ICD-10-CM | POA: Insufficient documentation

## 2019-03-19 DIAGNOSIS — Y929 Unspecified place or not applicable: Secondary | ICD-10-CM | POA: Insufficient documentation

## 2019-03-19 DIAGNOSIS — R45851 Suicidal ideations: Secondary | ICD-10-CM | POA: Insufficient documentation

## 2019-03-19 DIAGNOSIS — Z046 Encounter for general psychiatric examination, requested by authority: Secondary | ICD-10-CM | POA: Insufficient documentation

## 2019-03-19 DIAGNOSIS — F4325 Adjustment disorder with mixed disturbance of emotions and conduct: Secondary | ICD-10-CM | POA: Diagnosis present

## 2019-03-19 DIAGNOSIS — F4321 Adjustment disorder with depressed mood: Secondary | ICD-10-CM | POA: Insufficient documentation

## 2019-03-19 DIAGNOSIS — Z008 Encounter for other general examination: Secondary | ICD-10-CM

## 2019-03-19 DIAGNOSIS — X789XXA Intentional self-harm by unspecified sharp object, initial encounter: Secondary | ICD-10-CM | POA: Insufficient documentation

## 2019-03-19 DIAGNOSIS — S31119A Laceration without foreign body of abdominal wall, unspecified quadrant without penetration into peritoneal cavity, initial encounter: Secondary | ICD-10-CM | POA: Insufficient documentation

## 2019-03-19 DIAGNOSIS — F17210 Nicotine dependence, cigarettes, uncomplicated: Secondary | ICD-10-CM | POA: Insufficient documentation

## 2019-03-19 DIAGNOSIS — Y9389 Activity, other specified: Secondary | ICD-10-CM | POA: Insufficient documentation

## 2019-03-19 LAB — COMPREHENSIVE METABOLIC PANEL
ALT: 10 U/L (ref 0–44)
AST: 14 U/L — ABNORMAL LOW (ref 15–41)
Albumin: 4.3 g/dL (ref 3.5–5.0)
Alkaline Phosphatase: 71 U/L (ref 38–126)
Anion gap: 11 (ref 5–15)
BUN: 6 mg/dL (ref 6–20)
CO2: 26 mmol/L (ref 22–32)
Calcium: 8.7 mg/dL — ABNORMAL LOW (ref 8.9–10.3)
Chloride: 103 mmol/L (ref 98–111)
Creatinine, Ser: 0.85 mg/dL (ref 0.61–1.24)
GFR calc Af Amer: >60 mL/min (ref >60)
GFR calc non Af Amer: >60 mL/min (ref >60)
Glucose, Bld: 107 mg/dL — ABNORMAL HIGH (ref 70–99)
Potassium: 3.2 mmol/L — ABNORMAL LOW (ref 3.5–5.1)
Sodium: 140 mmol/L (ref 135–145)
Total Bilirubin: 0.1 mg/dL — ABNORMAL LOW (ref 0.3–1.2)
Total Protein: 6.9 g/dL (ref 6.5–8.1)

## 2019-03-19 LAB — CBC WITH DIFFERENTIAL/PLATELET
Abs Immature Granulocytes: 0.02 10*3/uL (ref 0.00–0.07)
Basophils Absolute: 0 10*3/uL (ref 0.0–0.1)
Basophils Relative: 1 %
Eosinophils Absolute: 0 10*3/uL (ref 0.0–0.5)
Eosinophils Relative: 0 %
HCT: 48.5 % (ref 39.0–52.0)
Hemoglobin: 16.5 g/dL (ref 13.0–17.0)
Immature Granulocytes: 0 %
Lymphocytes Relative: 26 %
Lymphs Abs: 2.1 10*3/uL (ref 0.7–4.0)
MCH: 30.1 pg (ref 26.0–34.0)
MCHC: 34 g/dL (ref 30.0–36.0)
MCV: 88.3 fL (ref 80.0–100.0)
Monocytes Absolute: 0.7 10*3/uL (ref 0.1–1.0)
Monocytes Relative: 8 %
Neutro Abs: 5.3 10*3/uL (ref 1.7–7.7)
Neutrophils Relative %: 65 %
Platelets: 291 10*3/uL (ref 150–400)
RBC: 5.49 MIL/uL (ref 4.22–5.81)
RDW: 12.6 % (ref 11.5–15.5)
WBC: 8.1 10*3/uL (ref 4.0–10.5)
nRBC: 0 % (ref 0.0–0.2)

## 2019-03-19 LAB — RAPID URINE DRUG SCREEN, HOSP PERFORMED
Amphetamines: NOT DETECTED
Barbiturates: NOT DETECTED
Benzodiazepines: NOT DETECTED
Cocaine: NOT DETECTED
Opiates: NOT DETECTED
Tetrahydrocannabinol: NOT DETECTED

## 2019-03-19 LAB — SALICYLATE LEVEL: Salicylate Lvl: <7 mg/dL (ref 2.8–30.0)

## 2019-03-19 LAB — ACETAMINOPHEN LEVEL: Acetaminophen (Tylenol), Serum: <10 ug/mL — ABNORMAL LOW (ref 10–30)

## 2019-03-19 LAB — ETHANOL: Alcohol, Ethyl (B): <10 mg/dL (ref <10)

## 2019-03-19 MED ORDER — NICOTINE 21 MG/24HR TD PT24
21.0000 mg | MEDICATED_PATCH | Freq: Every day | TRANSDERMAL | Status: DC
  Administered 2019-03-19 (×2): 21 mg via TRANSDERMAL
  Filled 2019-03-19 (×2): qty 1

## 2019-03-19 MED ORDER — BACITRACIN ZINC 500 UNIT/GM EX OINT
TOPICAL_OINTMENT | Freq: Once | CUTANEOUS | Status: AC
  Administered 2019-03-19: 1 via TOPICAL
  Filled 2019-03-19: qty 0.9
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
## 2019-03-19 NOTE — ED Notes (Signed)
Tele psych machine at bedside 

## 2019-03-19 NOTE — ED Notes (Addendum)
This Probation officer brought in patient's breakfast tray, patient stated "I need bandages more than I need food." This writer advised that his nurse would be notified.
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
## 2019-03-19 NOTE — ED Notes (Signed)
Pt d/c home per MD order. Denies SI/HI/AVH. Personal property returned.
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
## 2019-03-19 NOTE — ED Notes (Signed)
Pt provided with food and drink. Pt has call bell within reach and the bed in the lowest position. Pt's door is open at this time and in view of staff from nurses station, as there is no sitter at this time for pt.

## 2019-03-19 NOTE — ED Notes (Signed)
Peer support at bedside 
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
## 2019-10-26 IMAGING — DX DG ANKLE COMPLETE 3+V*L*
3 series · 3 of 3 positions shown · non-contrast
Comparison: Prior radiograph from 05/19/2019.

CLINICAL DATA: Initial evaluation for worsening pain, recent
injury.

EXAM:
LEFT ANKLE COMPLETE - 3+ VIEW

[x ankle ap left]
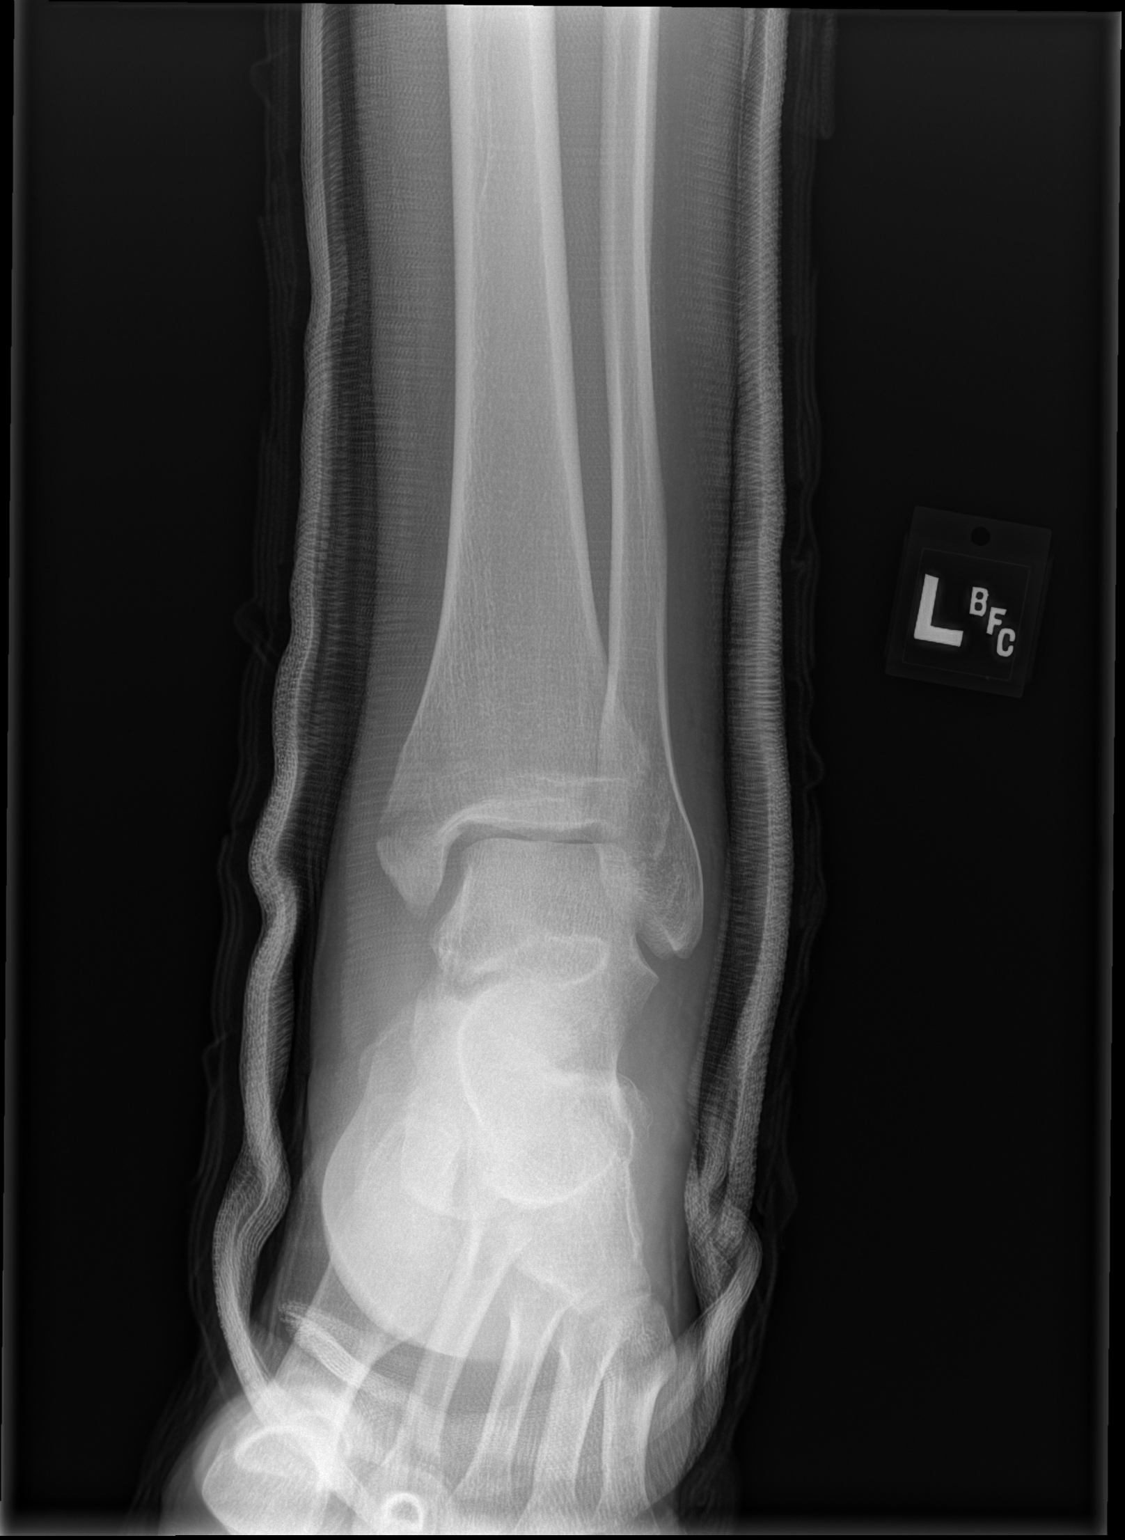

[x ankle obl left]
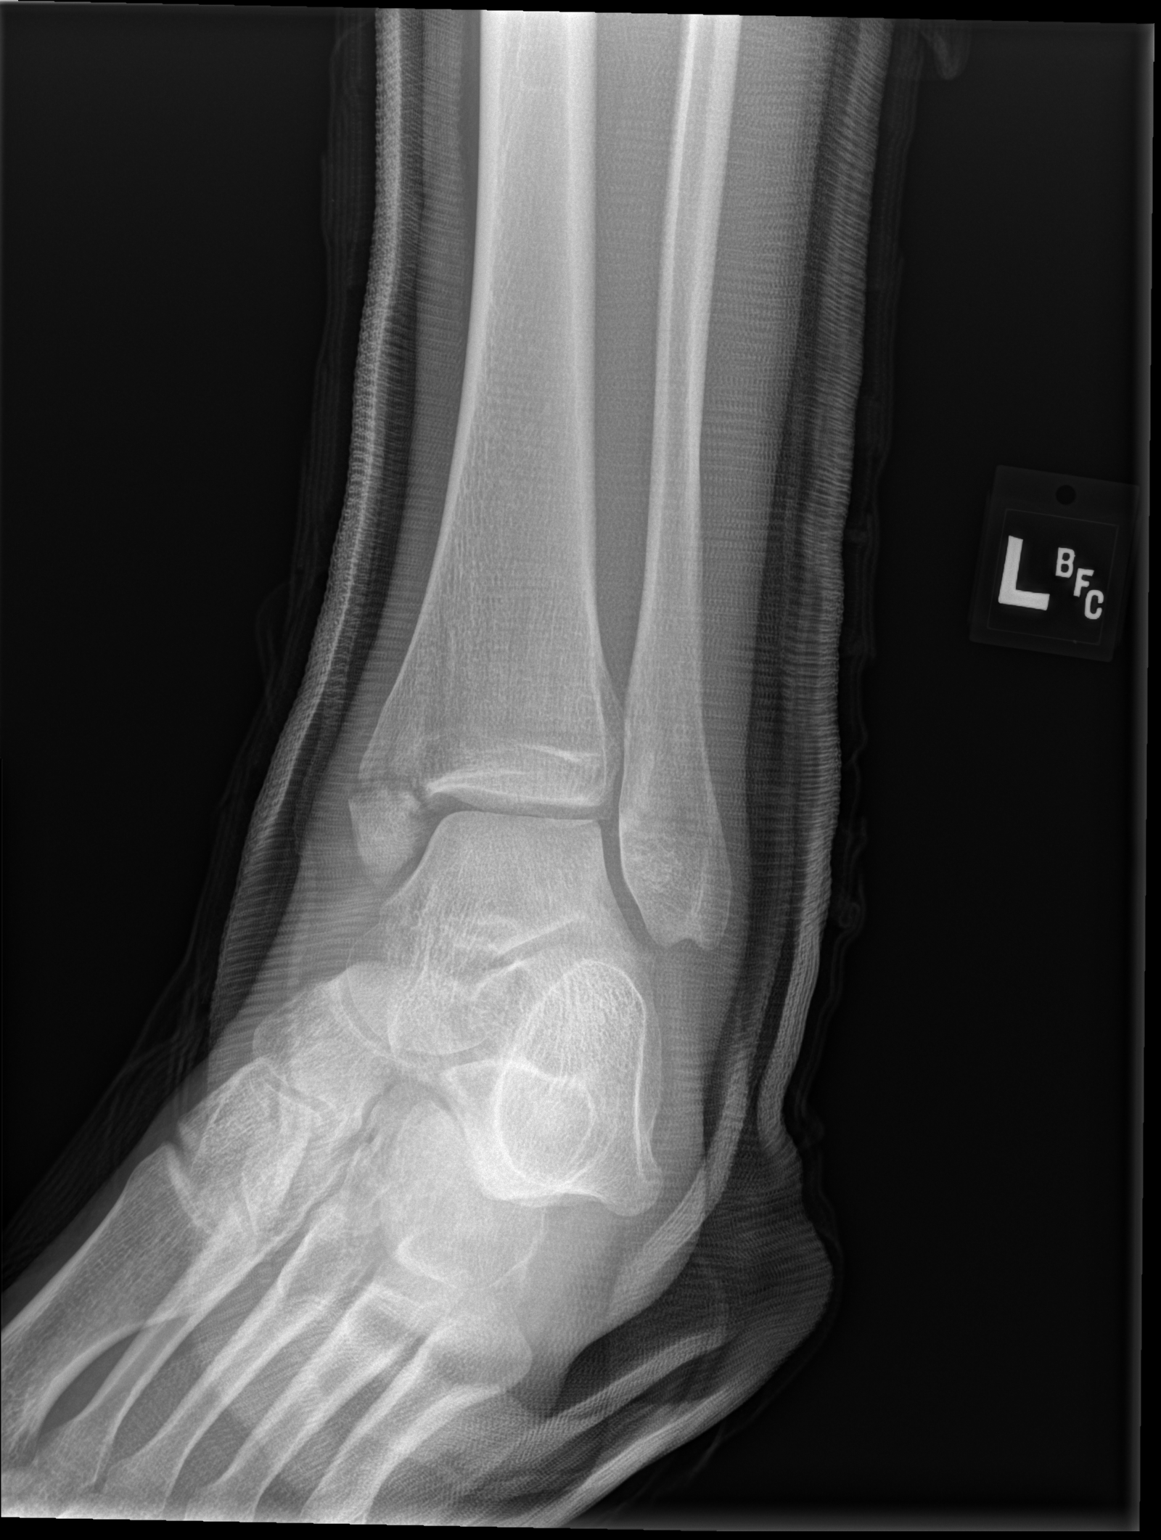

[x ankle lat left]
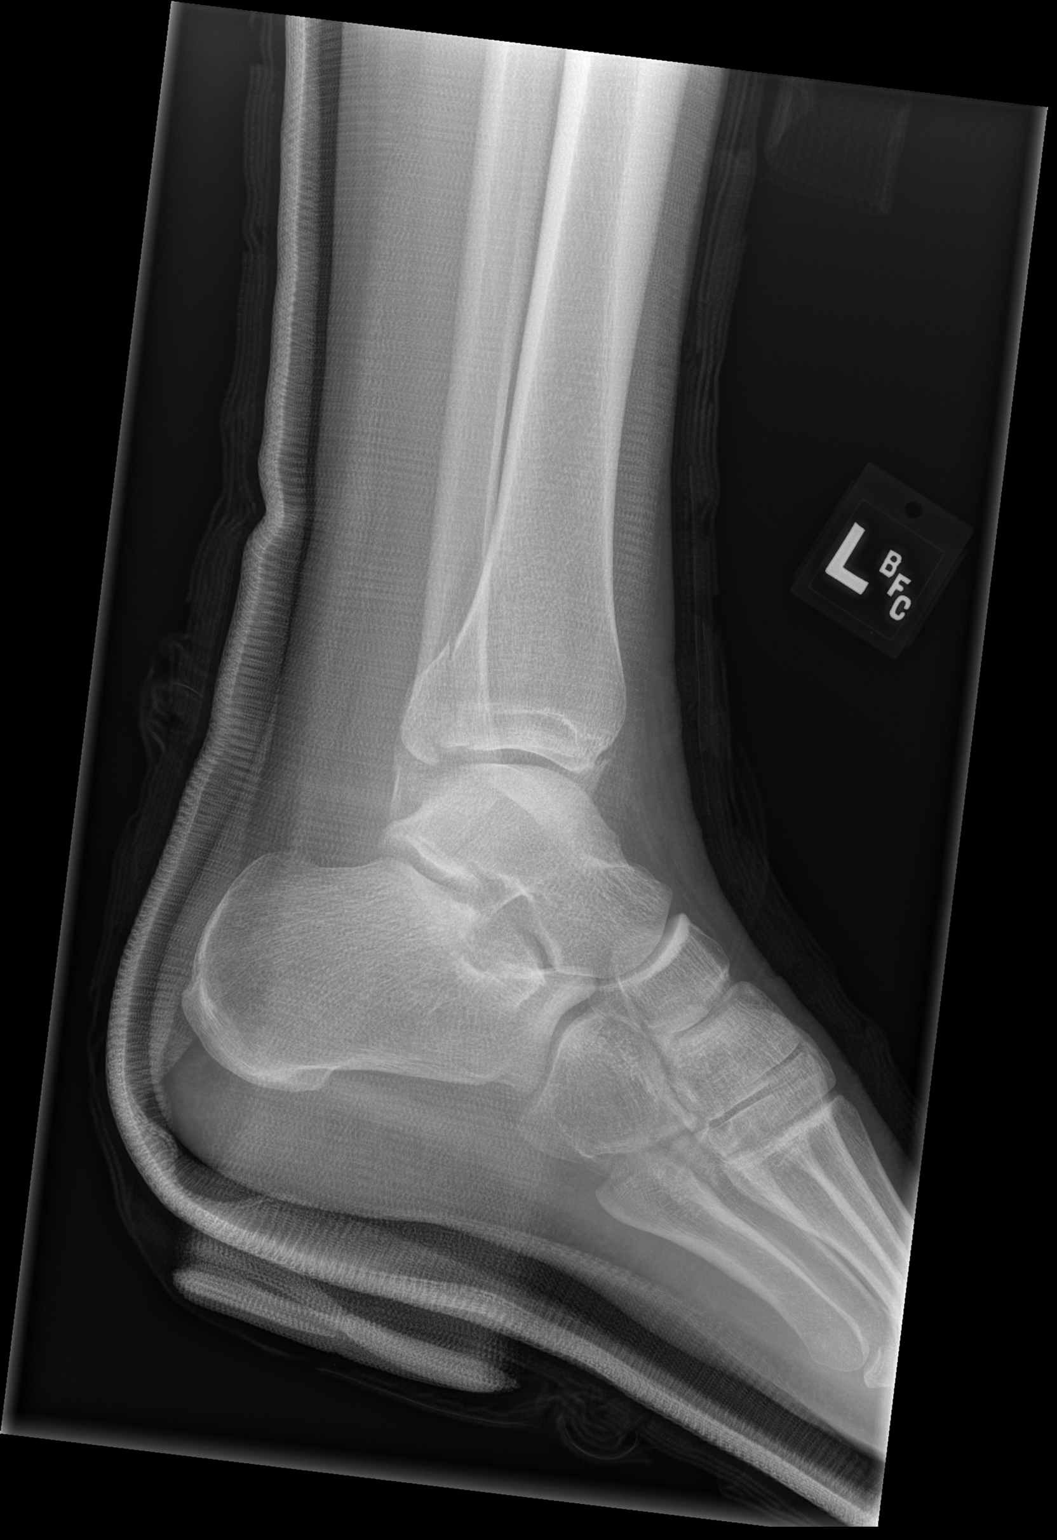

[3 of 3 positions shown; findings below may reference images not displayed]

FINDINGS: Splinting material overlies the left ankle, limiting evaluation for
fine osseous detail. Previously identified anterior, posterior, and
medial malleolar fractures are relatively stable in position with
minimal displacement. No significant interval healing callus
formation evident at this time. No new fracture or other osseous
abnormality. Ankle mortise remains approximated. Soft tissue
swelling noted about the ankle.
IMPRESSION: 1. Interval splinting with stable position and alignment of
previously identified anterior, posterior, and medial malleolar
fractures without complication.
2. No new osseous abnormality about the ankle.

## 2020-02-13 ENCOUNTER — Emergency Department (HOSPITAL_COMMUNITY): Payer: Self-pay

## 2020-02-13 ENCOUNTER — Other Ambulatory Visit: Payer: Self-pay

## 2020-02-13 ENCOUNTER — Encounter (HOSPITAL_COMMUNITY): Payer: Self-pay

## 2020-02-13 ENCOUNTER — Emergency Department (HOSPITAL_COMMUNITY)
Admission: EM | Admit: 2020-02-13 | Discharge: 2020-02-13 | Disposition: A | Discharge status: Home / Self Care | Payer: Self-pay | Attending: Emergency Medicine | Admitting: Emergency Medicine

## 2020-02-13 DIAGNOSIS — Y999 Unspecified external cause status: Secondary | ICD-10-CM | POA: Insufficient documentation

## 2020-02-13 DIAGNOSIS — Y929 Unspecified place or not applicable: Secondary | ICD-10-CM | POA: Insufficient documentation

## 2020-02-13 DIAGNOSIS — F17210 Nicotine dependence, cigarettes, uncomplicated: Secondary | ICD-10-CM | POA: Insufficient documentation

## 2020-02-13 DIAGNOSIS — M549 Dorsalgia, unspecified: Secondary | ICD-10-CM | POA: Insufficient documentation

## 2020-02-13 DIAGNOSIS — R0789 Other chest pain: Secondary | ICD-10-CM | POA: Insufficient documentation

## 2020-02-13 DIAGNOSIS — Y9389 Activity, other specified: Secondary | ICD-10-CM | POA: Insufficient documentation

## 2020-02-13 DIAGNOSIS — R519 Headache, unspecified: Secondary | ICD-10-CM | POA: Insufficient documentation

## 2020-02-13 MED ORDER — METHOCARBAMOL 500 MG PO TABS: 500.0000 mg | ORAL_TABLET | Freq: Two times a day (BID) | ORAL | 0 refills | Status: AC | PRN
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
## 2020-02-13 NOTE — Discharge Instructions (Addendum)
Take ibuprofen 3 times a day with meals.  Do not take other anti-inflammatories at the same time (Advil, Motrin, naproxen, Aleve). You may supplement with Tylenol if you need further pain control. Use robaxin as needed for muscle stiffness or soreness.  Have caution, this may make you tired or groggy.  Do not drive or operate heavy machinery while taking this medicine. Use ice packs or heating pads if this helps control your pain. You will likely have continued muscle stiffness and soreness over the next couple days.  Return to the emergency room if you develop vision changes, vomiting, slurred speech, numbness, loss of bowel or bladder control, or any new or worsening symptoms.
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
## 2020-03-06 ENCOUNTER — Emergency Department (HOSPITAL_COMMUNITY)
Admission: EM | Admit: 2020-03-06 | Discharge: 2020-03-06 | Disposition: A | Discharge status: Home / Self Care | Payer: Self-pay | Attending: Emergency Medicine | Admitting: Emergency Medicine

## 2020-03-06 ENCOUNTER — Emergency Department (HOSPITAL_COMMUNITY): Payer: Self-pay

## 2020-03-06 ENCOUNTER — Encounter (HOSPITAL_COMMUNITY): Payer: Self-pay | Admitting: Emergency Medicine

## 2020-03-06 DIAGNOSIS — H9209 Otalgia, unspecified ear: Secondary | ICD-10-CM

## 2020-03-06 DIAGNOSIS — J039 Acute tonsillitis, unspecified: Secondary | ICD-10-CM

## 2020-03-06 DIAGNOSIS — R1312 Dysphagia, oropharyngeal phase: Secondary | ICD-10-CM

## 2020-03-06 DIAGNOSIS — F17200 Nicotine dependence, unspecified, uncomplicated: Secondary | ICD-10-CM | POA: Insufficient documentation

## 2020-03-06 DIAGNOSIS — J36 Peritonsillar abscess: Secondary | ICD-10-CM | POA: Insufficient documentation

## 2020-03-06 LAB — COMPREHENSIVE METABOLIC PANEL
ALT: 14 U/L (ref 0–44)
AST: 14 U/L — ABNORMAL LOW (ref 15–41)
Albumin: 3.7 g/dL (ref 3.5–5.0)
Alkaline Phosphatase: 68 U/L (ref 38–126)
Anion gap: 11 (ref 5–15)
BUN: 7 mg/dL (ref 6–20)
CO2: 25 mmol/L (ref 22–32)
Calcium: 8.8 mg/dL — ABNORMAL LOW (ref 8.9–10.3)
Chloride: 100 mmol/L (ref 98–111)
Creatinine, Ser: 0.75 mg/dL (ref 0.61–1.24)
GFR calc Af Amer: >60 mL/min (ref >60)
GFR calc non Af Amer: >60 mL/min (ref >60)
Glucose, Bld: 100 mg/dL — ABNORMAL HIGH (ref 70–99)
Potassium: 4.1 mmol/L (ref 3.5–5.1)
Sodium: 136 mmol/L (ref 135–145)
Total Bilirubin: 0.7 mg/dL (ref 0.3–1.2)
Total Protein: 7.4 g/dL (ref 6.5–8.1)

## 2020-03-06 LAB — CBC
HCT: 48 % (ref 39.0–52.0)
Hemoglobin: 16.3 g/dL (ref 13.0–17.0)
MCH: 30.6 pg (ref 26.0–34.0)
MCHC: 34 g/dL (ref 30.0–36.0)
MCV: 90.2 fL (ref 80.0–100.0)
Platelets: 341 10*3/uL (ref 150–400)
RBC: 5.32 MIL/uL (ref 4.22–5.81)
RDW: 12.5 % (ref 11.5–15.5)
WBC: 16.4 10*3/uL — ABNORMAL HIGH (ref 4.0–10.5)
nRBC: 0 % (ref 0.0–0.2)

## 2020-03-06 LAB — GROUP A STREP BY PCR: Group A Strep by PCR: DETECTED — AB

## 2020-03-06 MED ORDER — METHYLPREDNISOLONE 4 MG PO TBPK
ORAL_TABLET | ORAL | 0 refills | Status: AC
Start: 2020-03-06 — End: ?

## 2020-03-06 MED ORDER — HYDROMORPHONE HCL 1 MG/ML IJ SOLN
0.5000 mg | Freq: Once | INTRAMUSCULAR | Status: AC
  Administered 2020-03-06: 0.5 mg via INTRAVENOUS
  Filled 2020-03-06: qty 1

## 2020-03-06 MED ORDER — IOHEXOL 300 MG/ML  SOLN
75.0000 mL | Freq: Once | INTRAMUSCULAR | Status: AC | PRN
  Administered 2020-03-06: 75 mL via INTRAVENOUS

## 2020-03-06 MED ORDER — METHYLPREDNISOLONE SODIUM SUCC 125 MG IJ SOLR
125.0000 mg | Freq: Once | INTRAMUSCULAR | Status: AC
  Administered 2020-03-06: 125 mg via INTRAVENOUS
  Filled 2020-03-06: qty 2

## 2020-03-06 MED ORDER — SODIUM CHLORIDE 0.9 % IV SOLN
3.0000 g | Freq: Once | INTRAVENOUS | Status: AC
  Administered 2020-03-06: 3 g via INTRAVENOUS
  Filled 2020-03-06: qty 8

## 2020-03-06 MED ORDER — LIDOCAINE-EPINEPHRINE (PF) 2 %-1:200000 IJ SOLN
INTRAMUSCULAR | Status: AC
  Administered 2020-03-06: 20 mL
  Filled 2020-03-06: qty 20

## 2020-03-06 MED ORDER — CLINDAMYCIN HCL 300 MG PO CAPS
300.0000 mg | ORAL_CAPSULE | Freq: Four times a day (QID) | ORAL | 0 refills | Status: AC
Start: 2020-03-06 — End: 2020-03-13

## 2020-03-06 MED ORDER — HYDROCODONE-ACETAMINOPHEN 5-325 MG PO TABS: 1.0000 | ORAL_TABLET | ORAL | 0 refills | Status: AC | PRN

## 2020-03-06 MED ORDER — SODIUM CHLORIDE 0.9 % IV BOLUS
1000.0000 mL | Freq: Once | INTRAVENOUS | Status: AC
  Administered 2020-03-06: 1000 mL via INTRAVENOUS

## 2020-03-06 MED ORDER — SODIUM CHLORIDE (PF) 0.9 % IJ SOLN
INTRAMUSCULAR | Status: AC
  Filled 2020-03-06: qty 50

## 2020-03-06 MED ORDER — LIDOCAINE HCL URETHRAL/MUCOSAL 2 % EX GEL
1.0000 "application " | Freq: Once | CUTANEOUS | Status: AC
  Administered 2020-03-06: 1 via TOPICAL
  Filled 2020-03-06: qty 22

## 2020-03-06 NOTE — Discharge Instructions (Addendum)
You were evaluated in the Emergency Department and after careful evaluation, we did not find any emergent condition requiring admission or further testing in the hospital.  Your symptoms seemed to be due to an abscess near the tonsil.  This was drained by the ear nose and throat specialist.  Please take the clindamycin antibiotic as directed.  Please take the Medrol steroid pack as directed.  You can use the Norco pain medication as needed.  Please return to the Emergency Department if you experience any worsening of your condition.  We encourage you to follow up with a primary care provider.  Thank you for allowing [place] to be a part of your care.

## 2020-03-06 NOTE — ED Provider Notes (Signed)
[facility]-EMERGENCY DEPT [facility] Emergency Department Provider Note MRN:  [id number]  Arrival date & time: 03/06/20     Chief Complaint   Sore Throat   History of Present Illness   Johnathan Flores is a 24 y.o. year-old male with no pertinent past medical history presenting to the ED with chief complaint of sore throat.  1 week of progressively worsening sore throat, mostly on the left side.  Worsening swelling and pain, unable to eat for the past few days, trouble opening the mouth, feeling subjective fevers, body aches.  Denies chest pain or shortness of breath, no abdominal pain, no rashes, no other complaints.  Pain is currently moderate to severe, constant.  Review of Systems  A complete 10 system review of systems was obtained and all systems are negative except as noted in the HPI and PMH.   Patient's Health History    Past Medical History:  Diagnosis Date  . Neck injury     History reviewed. No pertinent surgical history.  No family history on file.  Social History   Socioeconomic History  . Marital status: Single    Spouse name: Not on file  . Number of children: Not on file  . Years of education: Not on file  . Highest education level: Not on file  Occupational History  . Not on file  Tobacco Use  . Smoking status: Current Every Day Smoker    Packs/day: 1.00  . Smokeless tobacco: Never Used  Substance and Sexual Activity  . Alcohol use: Yes    Comment: rarely  . Drug use: Yes    Types: Marijuana  . Sexual activity: Not on file  Other Topics Concern  . Not on file  Social History Narrative  . Not on file   Social Determinants of Health   Financial Resource Strain:   . Difficulty of Paying Living Expenses:   Food Insecurity:   . Worried About Programme researcher, broadcasting/film/video in the Last Year:   . Barista in the Last Year:   Transportation Needs:   . Freight forwarder (Medical):   Marland Kitchen Lack of Transportation (Non-Medical):   Physical  Activity:   . Days of Exercise per Week:   . Minutes of Exercise per Session:   Stress:   . Feeling of Stress :   Social Connections:   . Frequency of Communication with Friends and Family:   . Frequency of Social Gatherings with Friends and Family:   . Attends Religious Services:   . Active Member of Clubs or Organizations:   . Attends Banker Meetings:   Marland Kitchen Marital Status:   Intimate Partner Violence:   . Fear of Current or Ex-Partner:   . Emotionally Abused:   Marland Kitchen Physically Abused:   . Sexually Abused:      Physical Exam   Vitals:   03/06/20 1209  BP: 125/72  Pulse: 96  Resp: 17  Temp: 98.8 F (37.1 C)  SpO2: 98%    CONSTITUTIONAL: Well-appearing, NAD NEURO:  Alert and oriented x 3, no focal deficits EYES:  eyes equal and reactive ENT/NECK:  no LAD, no JVD; grossly asymmetric left-sided edema and erythema to the posterior oropharynx CARDIO: Regular rate, well-perfused, normal S1 and S2 PULM:  CTAB no wheezing or rhonchi GI/GU:  normal bowel sounds, non-distended, non-tender MSK/SPINE:  No gross deformities, no edema SKIN:  no rash, atraumatic PSYCH:  Appropriate speech and behavior  *Additional and/or pertinent findings included in MDM below  Diagnostic and Interventional Summary    EKG Interpretation  Date/Time:    Ventricular Rate:    PR Interval:    QRS Duration:   QT Interval:    QTC Calculation:   R Axis:     Text Interpretation:        Labs Reviewed  GROUP A STREP BY PCR - Abnormal; Notable for the following components:      Result Value   Group A Strep by PCR DETECTED (*)    All other components within normal limits  CBC - Abnormal; Notable for the following components:   WBC 16.4 (*)    All other components within normal limits  COMPREHENSIVE METABOLIC PANEL - Abnormal; Notable for the following components:   Glucose, Bld 100 (*)    Calcium 8.8 (*)    AST 14 (*)    All other components within normal limits    CT Soft Tissue  Neck W Contrast  Final Result      Medications  sodium chloride (PF) 0.9 % injection (has no administration in time range)  lidocaine-EPINEPHrine (XYLOCAINE W/EPI) 2 %-1:200000 (PF) injection (has no administration in time range)  HYDROmorphone (DILAUDID) injection 0.5 mg (0.5 mg Intravenous Given 03/06/20 1235)  methylPREDNISolone sodium succinate (SOLU-MEDROL) 125 mg/2 mL injection 125 mg (125 mg Intravenous Given 03/06/20 1242)  Ampicillin-Sulbactam (UNASYN) 3 g in sodium chloride 0.9 % 100 mL IVPB (0 g Intravenous Stopped 03/06/20 1321)  sodium chloride 0.9 % bolus 1,000 mL (0 mLs Intravenous Stopped 03/06/20 1404)  iohexol (OMNIPAQUE) 300 MG/ML solution 75 mL (75 mLs Intravenous Contrast Given 03/06/20 1239)  lidocaine (XYLOCAINE) 2 % jelly 1 application (1 application Topical Given 03/06/20 1406)     Procedures  /  Critical Care .Marland KitchenIncision and Drainage  Date/Time: 03/06/2020 2:44 PM Performed by: Sabas Sous, MD Authorized by: Sabas Sous, MD   Consent:    Consent obtained:  Verbal   Consent given by:  Patient   Risks discussed:  Bleeding, damage to other organs, incomplete drainage and pain Location:    Type:  Abscess   Size:  2.8cm   Location:  Mouth   Mouth location:  Peritonsillar Anesthesia (see MAR for exact dosages):    Anesthesia method:  Topical application   Topical anesthetic:  Lidocaine gel Procedure type:    Complexity:  Simple Procedure details:    Needle aspiration: yes     Needle size:  18 G   Drainage:  Bloody   Drainage amount:  Scant   Wound treatment:  Wound left open   Packing materials:  None Post-procedure details:    Patient tolerance of procedure:  Procedure terminated at patient's request    ED Course and Medical Decision Making  I have reviewed the triage vital signs, the nursing notes, and pertinent available records from the EMR.  Listed above are laboratory and imaging tests that I personally ordered, reviewed, and interpreted  and then considered in my medical decision making (see below for details).      Suspect peritonsillar abscess, patient also having pain with moving the neck and trismus, will CT to exclude deeper space infection.  Will consider I&D after imaging.  Providing empiric steroids, antibiotics.  CT confirms large left peritonsillar abscess.  Drainage attempted as described above, unable to remove any appreciable infected fluid.  Will consult ENT for further recommendations.  Patient continues to have fully patent airway, he is more comfortable than he was upon arrival.  ENT was consulted and  we are appreciative of Dr. Marcelline Deist for his bedside assistance.  Drained successfully, patient is appropriate for discharge on antibiotics.  Barth Kirks. [place]onia Small, [place] [email or web address]  Final Clinical Impressions(s) / ED Diagnoses     ICD-10-CM   1. Peritonsillar abscess  J36     ED Discharge Orders         Ordered    clindamycin (CLEOCIN) 300 MG capsule  4 times daily     03/06/20 1636    methylPREDNISolone (MEDROL DOSEPAK) 4 MG TBPK tablet     03/06/20 1636    HYDROcodone-acetaminophen (NORCO/VICODIN) 5-325 MG tablet  Every 4 hours PRN     03/06/20 1636           Discharge Instructions Discussed with and Provided to Patient:     Discharge Instructions     You were evaluated in the Emergency Department and after careful evaluation, we did not find any emergent condition requiring admission or further testing in the hospital.  Your symptoms seemed to be due to an abscess near the tonsil.  This was drained by the ear nose and throat specialist.  Please take the clindamycin antibiotic as directed.  Please take the Medrol steroid pack as directed.  You can use the Norco pain medication as needed.  Please return to the Emergency Department if you experience any worsening of your condition.  We encourage you to follow up with a primary care  provider.  Thank you for allowing [place] to be a part of your care.        Maudie Flakes, MD 03/06/20 [phone]

## 2020-03-06 NOTE — ED Notes (Signed)
MD at bedside. 

## 2020-03-06 NOTE — ED Triage Notes (Signed)
Pt c/o sore throat esp left side neck lymph nodes x week. Reports can't eat.

## 2020-03-06 NOTE — Final Consult Note (Signed)
Consultant Final Sign-Off Note    Assessment/Final recommendations    Left PTA Pharyngitis Dysphagia Referred otalgia  I reviewed the CT scan films and report.  It does appear that there is a collection of pus in the left PT space.  The ER MD has already tried to aspirate and was not able to locate the abscess. There is a chance that this is all a phlegmon/suppurative adenopathy but unlikely.  I will proceed with I&D of the PTA on the left side.  Risks discussed.  I anesthetized the left soft palate and incised the mucosa releasing a large amount of pus.  The wound was explored with a Kelly clamp and irrigated with saline and H2O2 gargles.  Bleeding was self-limiting.   1. Clindamycin 300 mg QID x 7 days  2.  Medrol dospak  3.  Pain meds  4.  Gargle with H2O2 and water.   Wound care (if applicable): Gargle TID with 1/2 strength H2O2 and water   Diet at discharge: ad lib   Activity at discharge: ad lib   Follow-up appointment:     Pending results:  Unresulted Labs (From admission, onward)   None       Medication recommendations:   Other recommendations:    Thank you for allowing Johnathan Flores to participate in the care of your patient!  Please consult Johnathan Flores again if you have further needs for your patient.  Johnathan Flores 03/06/2020 3:04 PM    Subjective   Johnathan Flores  Objective  Vital signs in last 24 hours: Temp:  [98.8 F (37.1 C)] 98.8 F (37.1 C) (05/31 1209) Pulse Rate:  [96] 96 (05/31 1209) Resp:  [17] 17 (05/31 1209) BP: (125)/(72) 125/72 (05/31 1209) SpO2:  [98 %] 98 % (05/31 1209)  General:  AAO x 3 - NAD PERRL/EOMI Face without trauma or bony stepoffs Chest sym expansions bilaterally without use of accessory muscles Neck - no masses or nodes/trachea midline Pinna normal with NT mastoids Nose - septum ML without mass or polyp Skin without petechiae or rash.  OP/OC - swollen left soft palate with rightward shift of the  uvula  Pertinent labs and Studies: Recent Labs    03/06/20 1237  WBC 16.4*  HGB 16.3  HCT 48.0   BMET Recent Labs    03/06/20 1237  NA 136  K 4.1  CL 100  CO2 25  GLUCOSE 100*  BUN 7  CREATININE 0.75  CALCIUM 8.8*   No results for input(s): LABURIN in the last 72 hours. Results for orders placed or performed during the hospital encounter of 03/06/20  Group A Strep by PCR     Status: Abnormal   Collection Time: 03/06/20 12:11 PM   Specimen: Throat; Sterile Swab  Result Value Ref Range Status   Group A Strep by PCR DETECTED (A) NOT DETECTED Final    Comment: Performed at [facility], [address] [place], [place] [address]    Imaging: CT Soft Tissue Neck W Contrast  Result Date: 03/06/2020 CLINICAL DATA:  Sore throat EXAM: CT NECK WITH CONTRAST TECHNIQUE: Multidetector CT imaging of the neck was performed using the standard protocol following the bolus administration of intravenous contrast. CONTRAST:  20mL OMNIPAQUE IOHEXOL 300 MG/ML  SOLN COMPARISON:  None. FINDINGS: Pharynx and larynx: Enlargement of the left palatine tonsil with an area of low attenuation measuring approximately 2.8 x 1.3 x 2.5 cm. There is mild prominence of the adenoids. Mild infiltration of left parapharyngeal fat.  Patent airway. Salivary glands: Unremarkable. Thyroid: Normal. Lymph nodes: Left greater than right nonenlarged and enlarged suprahyoid cervical lymph nodes, which are likely reactive. For example, there is a 2 cm left level 2 node. Vascular: Major neck vessels are patent. Limited intracranial: No abnormal enhancement. Visualized orbits: Unremarkable. Mastoids and visualized paranasal sinuses: Minor mucosal thickening. Mastoid air cells are clear. Skeleton: Nonspecific reversal of the cervical lordosis. Minor disc space narrowing and left uncovertebral hypertrophy at C5-C6. Upper chest: Included upper lungs are clear. Other: None. IMPRESSION: Findings consistent with  tonsillitis with left peritonsillar abscess. Reactive adenopathy. The airway is patent. Electronically Signed   By: Macy Mis M.D.   On: 03/06/2020 13:17

## 2020-03-22 ENCOUNTER — Encounter: Payer: Self-pay | Admitting: Urology

## 2020-03-22 ENCOUNTER — Ambulatory Visit: Payer: Self-pay | Admitting: Urology

## 2020-03-22 NOTE — Progress Notes (Incomplete)
03/22/20 12:38 PM   Johnathan Flores [facility] [birth year] [id number]  Referring provider: No referring provider defined for this encounter.  No chief complaint on file.   HPI: 24 y.o. year old male referred for further evaluation of possible vasectomy.  Denies a history of testicular trauma or pain.  No urinary issues.  No previous scrotal surgeries.   PMH: Past Medical History:  Diagnosis Date  . Neck injury     Surgical History: No past surgical history on file.  Home Medications:  Allergies as of 03/22/2020   No Known Allergies     Medication List       Accurate as of March 22, 2020 12:38 PM. If you have any questions, ask your nurse or doctor.        cyclobenzaprine 10 MG tablet Commonly known as: FLEXERIL Take 1 tablet (10 mg total) by mouth 2 (two) times daily as needed for muscle spasms.   HYDROcodone-acetaminophen 5-325 MG tablet Commonly known as: NORCO/VICODIN Take 1 tablet by mouth every 4 (four) hours as needed.   ibuprofen 600 MG tablet Commonly known as: ADVIL Take 1 tablet (600 mg total) by mouth every 6 (six) hours as needed.   magic mouthwash w/lidocaine Soln Take 10 mLs by mouth 3 (three) times daily as needed for mouth pain.   methocarbamol 500 MG tablet Commonly known as: ROBAXIN Take 1 tablet (500 mg total) by mouth 2 (two) times daily as needed for muscle spasms.   methylPREDNISolone 4 MG Tbpk tablet Commonly known as: MEDROL DOSEPAK Complete medication regimen per pack instructions.   naproxen 500 MG tablet Commonly known as: NAPROSYN Take 1 tablet (500 mg total) by mouth 2 (two) times daily.   ondansetron 4 MG disintegrating tablet Commonly known as: Zofran ODT Take 1 tablet (4 mg total) by mouth every 8 (eight) hours as needed for nausea or vomiting.       Allergies: No Known Allergies  Family History: No family history on file.  Social History:  reports that he has been smoking. He has been smoking about 1.00 pack per  day. He has never used smokeless tobacco. He reports current alcohol use. He reports current drug use. Drug: Marijuana.  ROS:                                        Physical Exam: There were no vitals taken for this visit.  Constitutional:  Alert and oriented, No acute distress. HEENT: [place] AT, moist mucus membranes.  Trachea midline, no masses. Cardiovascular: No clubbing, cyanosis, or edema. Respiratory: Normal respiratory effort, no increased work of breathing. GI: Abdomen is soft, nontender, nondistended, no abdominal masses GU: Normal phallus.  Bilateral descended testicles without masses.  Vasa easily palpable bilaterally. Skin: No rashes, bruises or suspicious lesions. Neurologic: Grossly intact, no focal deficits, moving all 4 extremities. Psychiatric: Normal mood and affect.  Laboratory Data: N/a  Urinalysis n/a  Pertinent Imaging: N/a  Assessment & Plan:    1. Vasectomy evaluation Today, we discussed what the vas deferens is, where it is located, and its function. We reviewed the procedure for vasectomy, it's risks, benefits, alternatives, and likelihood of achieving his goals. We discussed in detail the procedure, complications, and recovery as well as the need for clearance prior to unprotected intercourse. We discussed that vasectomy does not protect against sexually transmitted diseases. We discussed that this procedure does not result  in immediate sterility and that they would need to use other forms of birth control until he has been cleared with negative postvasectomy semen analyses. I explained that the procedure is considered to be permanent and that attempts at reversal have varying degrees of success. These options include vasectomy reversal, sperm retrieval, and in vitro fertilization; these can be very expensive. We discussed the chance of postvasectomy pain syndrome which occurs in less than 5% of patients. I explained to the patient that  there is no treatment to resolve this chronic pain, and that if it developed I would not be able to help resolve the issue, but that surgery is generally not needed for correction. I explained there have even been reports of systemic like illness associated with this chronic pain, and that there was no good cure. I explained that vasectomy it is not a 100% reliable form of birth control, and the risk of pregnancy after vasectomy is approximately 1 in 2000 men who had a negative postvasectomy semen analysis or rare non-motile sperm. I explained that repeat vasectomy was necessary in less than 1% of vasectomy procedures when employing the type of technique that I use. I explained that he should refrain from ejaculation for approximately one week following vasectomy. I explained that there are other options for birth control which are permanent and non-permanent; we discussed these. I explained the rates of surgical complications, such as symptomatic hematoma or infection, are low (1-2%) and vary with the surgeon's experience and criteria used to diagnose the complication.   The patient had the opportunity to ask questions to his stated satisfaction. He voiced understanding of the above factors and stated that he has read all the information provided to him and the packets and informed consent.  He is interested in receiving of Valium 10 mg prior to the procedure for the purpose of anxiolysis.  A prescription was given today.  He will have a driver on the day of the procedure.   No follow-ups on file.  [facility] [address] [place], [place] [address] [phone]  I, Lucas Mallow, am acting as a scribe for Dr. Hollice Espy,  {Add Scribe Attestation Statement}

## 2021-03-06 IMAGING — CT CT NECK W/ CM
3 of 4 series · 13 of 33 positions shown, 16 images · IV contrast (OMNIPAQUE 300)
Comparison: None.

CLINICAL DATA: Sore throat

EXAM:
CT NECK WITH CONTRAST
TECHNIQUE: Multidetector CT imaging of the neck was performed using the
standard protocol following the bolus administration of intravenous
contrast.
CONTRAST:  75mL OMNIPAQUE IOHEXOL 300 MG/ML  SOLN

[Series 3: axial neck · axial · 0.48mm/px · z∈[-260,-88]mm · 5 of 130 slices shown, 7 images]
[im 22/130  soft-tissue]
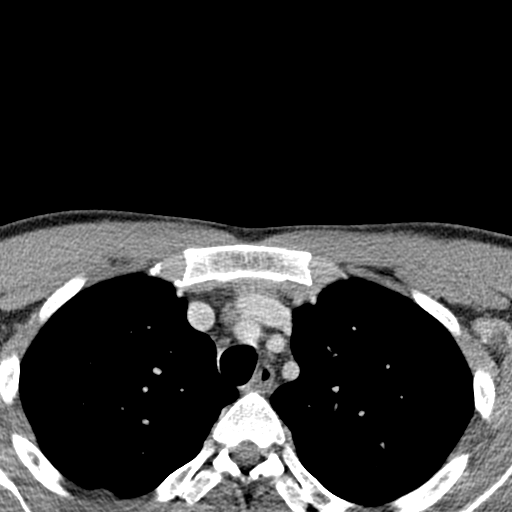
[im 22/130  bone]
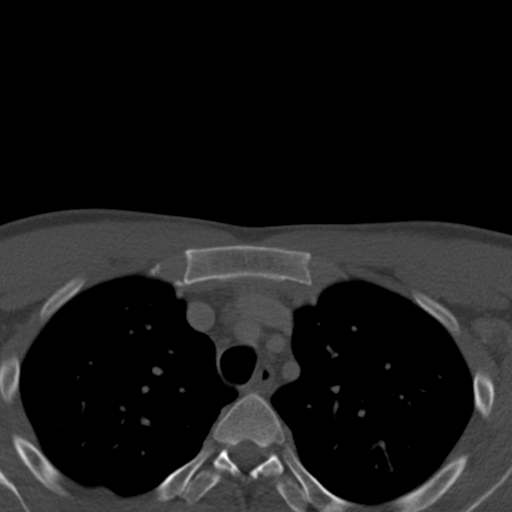
[im 44/130  bone]
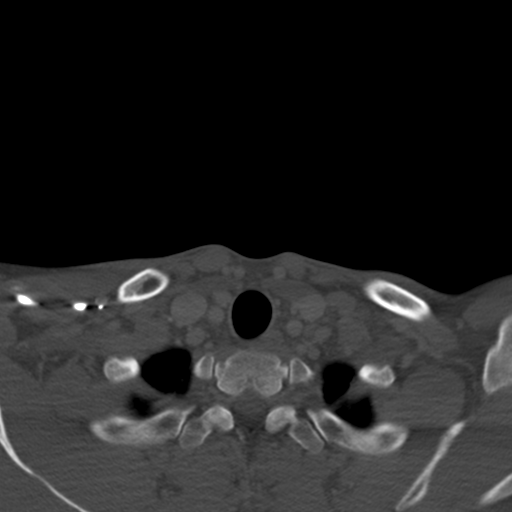
[im 65/130  bone]
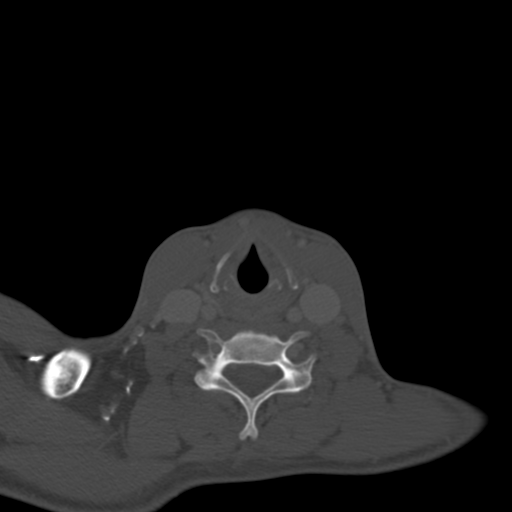
[im 87/130  bone]
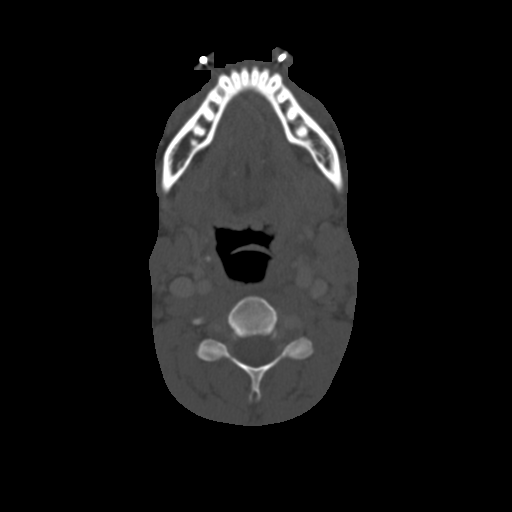
[im 108/130  soft-tissue]
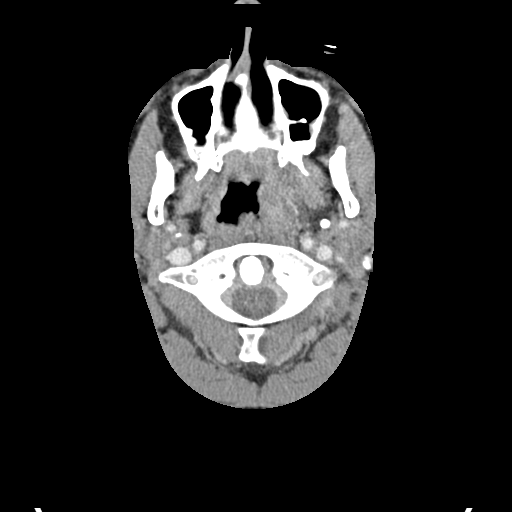
[im 108/130  bone]
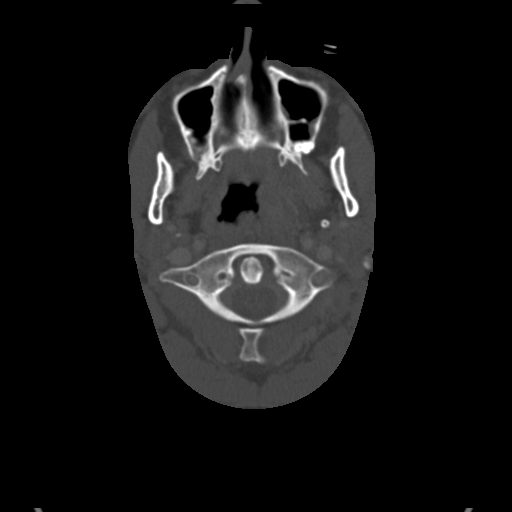

[Series 7: coronal · coronal · 0.33mm/px · 3 of 101 slices shown]
[im 30/101  bone]
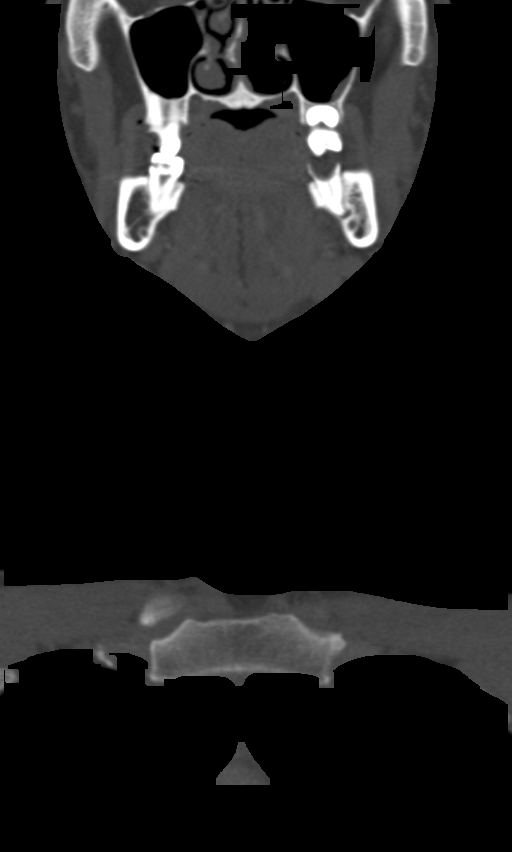
[im 44/101  bone]
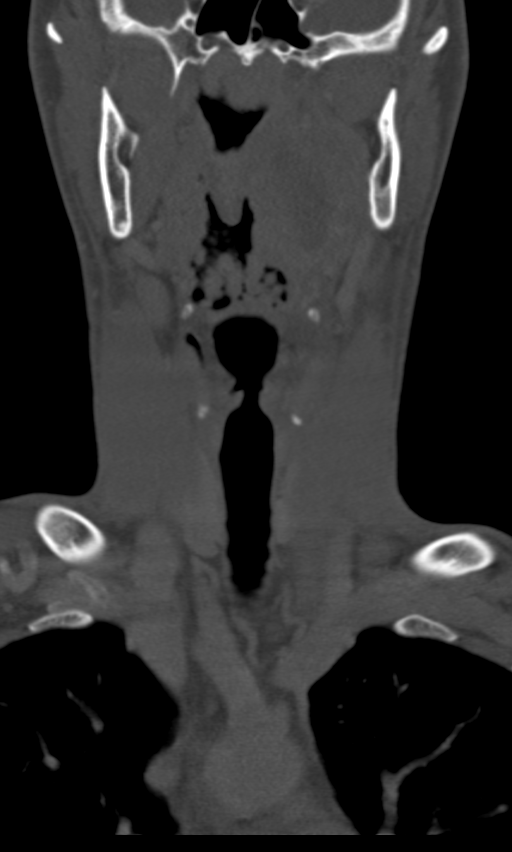
[im 57/101  bone]
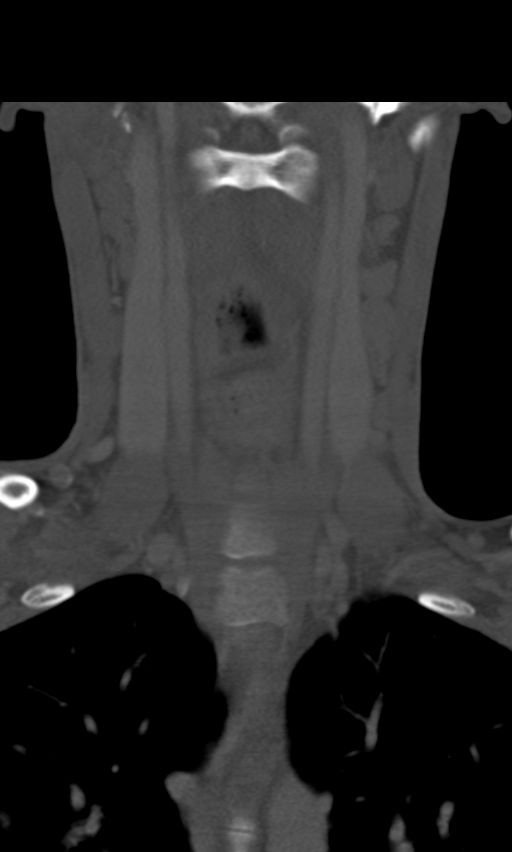

[Series 8: sagittal · sagittal · 0.51mm/px · 5 of 98 slices shown, 6 images]
[im 33/98  bone]
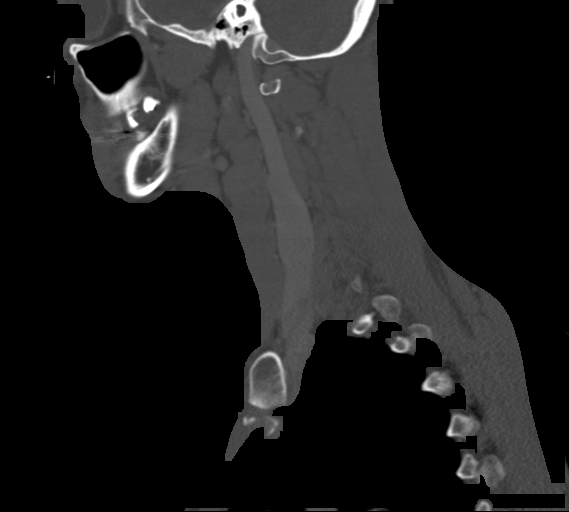
[im 41/98  bone]
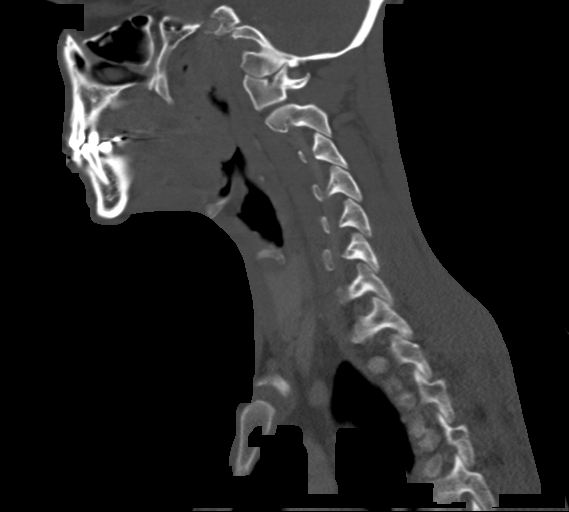
[im 49/98  soft-tissue]
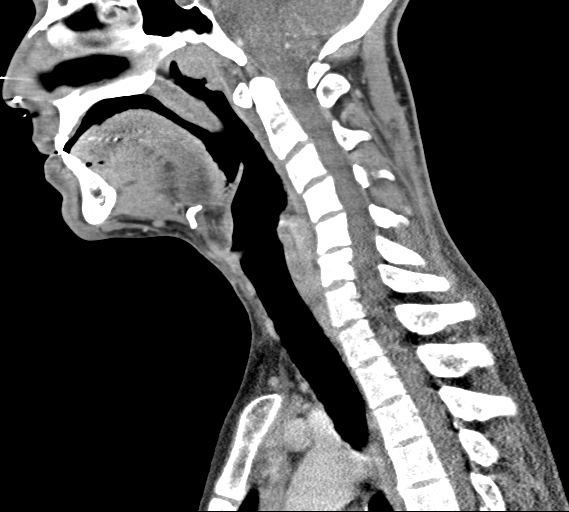
[im 49/98  bone]
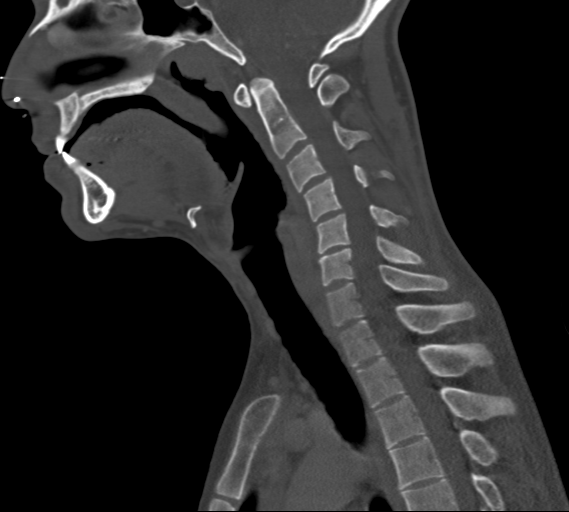
[im 57/98  bone]
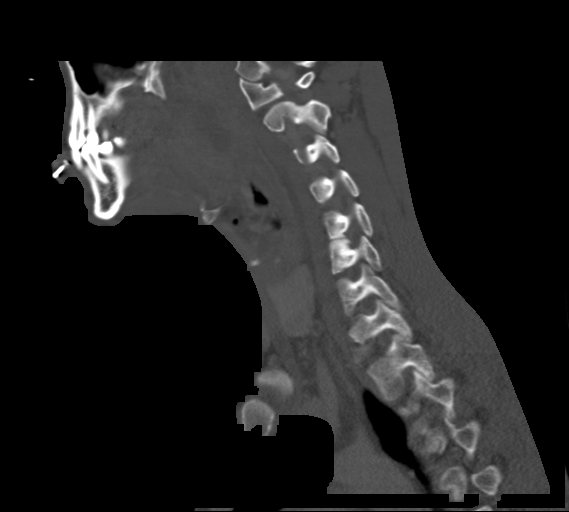
[im 65/98  bone]
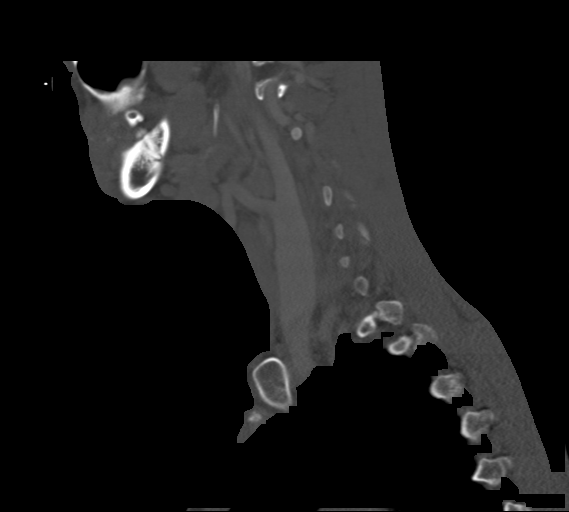

[13 of 33 positions shown; findings below may reference images not displayed]

FINDINGS: Pharynx and larynx: Enlargement of the left palatine tonsil with an
area of low attenuation measuring approximately 2.8 x 1.3 x 2.5 cm.
There is mild prominence of the adenoids. Mild infiltration of left
parapharyngeal fat. Patent airway.

Salivary glands: Unremarkable.

Thyroid: Normal.

Lymph nodes: Left greater than right nonenlarged and enlarged
suprahyoid cervical lymph nodes, which are likely reactive. For
example, there is a 2 cm left level 2 node.

Vascular: Major neck vessels are patent.

Limited intracranial: No abnormal enhancement.

Visualized orbits: Unremarkable.

Mastoids and visualized paranasal sinuses: Minor mucosal thickening.
Mastoid air cells are clear.

Skeleton: Nonspecific reversal of the cervical lordosis. Minor disc
space narrowing and left uncovertebral hypertrophy at C5-C6.

Upper chest: Included upper lungs are clear.

Other: None.
IMPRESSION: Findings consistent with tonsillitis with left peritonsillar
abscess. Reactive adenopathy. The airway is patent.
